# Patient Record
Sex: Female | Born: 1985 | Race: Black or African American | Hispanic: No | Marital: Single | Smoking: Current every day smoker
Health system: Southern US, Community
[De-identification: ages and names within clinical notes are randomized; demographics above are authoritative.]

## PROBLEM LIST (undated history)

## (undated) DIAGNOSIS — J40 Bronchitis, not specified as acute or chronic: Secondary | ICD-10-CM

## (undated) DIAGNOSIS — Z789 Other specified health status: Secondary | ICD-10-CM

## (undated) DIAGNOSIS — Z87442 Personal history of urinary calculi: Secondary | ICD-10-CM

## (undated) HISTORY — PX: KNEE ARTHROSCOPY W/ ACL RECONSTRUCTION: SHX1858

---

## 2016-02-25 ENCOUNTER — Encounter: Payer: Self-pay | Admitting: Emergency Medicine

## 2016-02-25 ENCOUNTER — Emergency Department
Admission: EM | Admit: 2016-02-25 | Discharge: 2016-02-25 | Disposition: A | Discharge status: Home / Self Care | Payer: Managed Care, Other (non HMO) | Attending: Emergency Medicine | Admitting: Emergency Medicine

## 2016-02-25 DIAGNOSIS — Y929 Unspecified place or not applicable: Secondary | ICD-10-CM | POA: Diagnosis not present

## 2016-02-25 DIAGNOSIS — Y999 Unspecified external cause status: Secondary | ICD-10-CM | POA: Diagnosis not present

## 2016-02-25 DIAGNOSIS — W228XXA Striking against or struck by other objects, initial encounter: Secondary | ICD-10-CM | POA: Insufficient documentation

## 2016-02-25 DIAGNOSIS — T180XXA Foreign body in mouth, initial encounter: Secondary | ICD-10-CM | POA: Diagnosis not present

## 2016-02-25 DIAGNOSIS — F17200 Nicotine dependence, unspecified, uncomplicated: Secondary | ICD-10-CM | POA: Insufficient documentation

## 2016-02-25 DIAGNOSIS — Y9389 Activity, other specified: Secondary | ICD-10-CM | POA: Insufficient documentation

## 2016-02-25 DIAGNOSIS — S00552A Superficial foreign body of oral cavity, initial encounter: Secondary | ICD-10-CM

## 2016-02-25 MED ORDER — CEPHALEXIN 500 MG PO CAPS: 500.0000 mg | ORAL_CAPSULE | Freq: Four times a day (QID) | ORAL | 0 refills | Status: AC

## 2016-02-25 MED ORDER — LIDOCAINE HCL (PF) 1 % IJ SOLN
5.0000 mL | Freq: Once | INTRAMUSCULAR | Status: AC
  Administered 2016-02-25: 5 mL
  Filled 2016-02-25: qty 5

## 2016-02-25 NOTE — ED Triage Notes (Signed)
States she has a tongue ring stuck   Unable to remove

## 2016-02-25 NOTE — ED Provider Notes (Signed)
[facility] Emergency Department Provider Note        Time seen: ----------------------------------------- 9:47 AM on 02/25/2016 -----------------------------------------    I have reviewed the triage vital signs and the nursing notes.   HISTORY  Chief Complaint Foreign Body    HPI Cassie Knapp is a 30 y.o. female who presents to ER after she has a tongue piercing stuck in the distal aspect of her tongue. Patient states she was unable to remove the tongue piercing. Patient states she's had them for some time, as never bothered her. Patient states the tip of her tongue is swollen and very painful to touch.   History reviewed. No pertinent past medical history.  There are no active problems to display for this patient.   History reviewed. No pertinent surgical history.  Allergies Patient has no known allergies.  Social History Social History  Substance Use Topics  . Smoking status: Current Every Day Smoker  . Smokeless tobacco: Never Used  . Alcohol use No    Review of Systems Constitutional: Negative for fever. ENT: Positive for tongue pain and swelling with foreign body Skin: Negative for rash. Neurological: Negative for headaches, focal weakness or numbness.  ____________________________________________   PHYSICAL EXAM:  VITAL SIGNS: ED Triage Vitals  Enc Vitals Group     BP 02/25/16 0918 119/66     Pulse Rate 02/25/16 0918 63     Resp 02/25/16 0918 15     Temp 02/25/16 0918 98.5 F (36.9 C)     Temp Source 02/25/16 0918 Oral     SpO2 02/25/16 0918 100 %     Weight 02/25/16 0908 165 lb (74.8 kg)     Height 02/25/16 0908 5\' 2"  (1.575 m)     Head Circumference --      Peak Flow --      Pain Score --      Pain Loc --      Pain Edu? --      Excl. in GC? --    Constitutional: Alert and oriented. Well appearing and in no distress. Eyes: Conjunctivae are normal. PERRL. Normal extraocular movements. ENT   Head:  Normocephalic and atraumatic.   Nose: No congestion/rhinnorhea.   Mouth/Throat: Patient with 2 piercings in her tongue, she has a vertical piercing through the distal third of the tongue and she has a horizontal piercing through the tip of the tongue. Tip of the tongue is swollen and tender to touch.   Neck: No stridor. Skin:  Skin is warm, dry and intact. No rash noted. Psychiatric: Mood and affect are normal. Speech and behavior are normal.  ____________________________________________  ED COURSE:  Pertinent labs & imaging results that were available during my care of the patient were reviewed by me and considered in my medical decision making (see chart for details). Clinical Course   Patient presents to the ER with possibly infected tongue piercing. We will need to perform foreign body removal.  .Foreign Body Removal Date/Time: 02/25/2016 10:30 AM Performed by: Emily FilbertWILLIAMS, Noam Karaffa E Authorized by: Daryel NovemberWILLIAMS, Kirk Sampley E  Consent: Verbal consent obtained. Consent given by: patient Patient understanding: patient states understanding of the procedure being performed Patient identity confirmed: verbally with patient Anesthesia: local infiltration  Anesthesia: Local Anesthetic: lidocaine 1% without epinephrine Anesthetic total: 3 mL  Sedation: Patient sedated: no Patient restrained: no Complexity: simple 1 objects recovered. Objects recovered: Tongue piercing Post-procedure assessment: foreign body removed Comments: Patient with a tongue piercing that appeared to be infected and the  tip of the tongue. This had been removed with bulk cutters. She will be placed on antibiotics, is stable for outpatient follow-up.   ____________________________________________  FINAL ASSESSMENT AND PLAN  Foreign body removal  Plan: Patient with foreign body removal as dictated above.Patient is no distress, will be discharged with Keflex. She is stable for discharge.   Emily FilbertWilliams,  Lisbeth Puller E, MD   Note: This dictation was prepared with Dragon dictation. Any transcriptional errors that result from this process are unintentional    Emily FilbertJonathan E Jaques Mineer, MD 02/25/16 1031

## 2016-02-25 NOTE — ED Notes (Signed)
Pt sent over from [facility]; reports changing her tongue ring approximately one week ago.  Pt now unable to remove tongue ring due to swelling of tongue.  Pt A/Ox4, able to speak full sentences, no respiratory distress noted at this time.

## 2016-05-26 ENCOUNTER — Emergency Department
Admission: EM | Admit: 2016-05-26 | Discharge: 2016-05-26 | Disposition: A | Discharge status: Home / Self Care | Payer: Commercial Managed Care - PPO | Attending: Emergency Medicine | Admitting: Emergency Medicine

## 2016-05-26 ENCOUNTER — Encounter: Payer: Self-pay | Admitting: Emergency Medicine

## 2016-05-26 DIAGNOSIS — J111 Influenza due to unidentified influenza virus with other respiratory manifestations: Secondary | ICD-10-CM | POA: Diagnosis not present

## 2016-05-26 DIAGNOSIS — J40 Bronchitis, not specified as acute or chronic: Secondary | ICD-10-CM | POA: Diagnosis not present

## 2016-05-26 DIAGNOSIS — R52 Pain, unspecified: Secondary | ICD-10-CM | POA: Diagnosis present

## 2016-05-26 DIAGNOSIS — F17200 Nicotine dependence, unspecified, uncomplicated: Secondary | ICD-10-CM | POA: Insufficient documentation

## 2016-05-26 LAB — POCT RAPID STREP A: Streptococcus, Group A Screen (Direct): NEGATIVE

## 2016-05-26 MED ORDER — ALBUTEROL SULFATE HFA 108 (90 BASE) MCG/ACT IN AERS: 1.0000 | INHALATION_SPRAY | Freq: Four times a day (QID) | RESPIRATORY_TRACT | 0 refills | Status: DC | PRN

## 2016-05-26 MED ORDER — OSELTAMIVIR PHOSPHATE 75 MG PO CAPS: 75.0000 mg | ORAL_CAPSULE | Freq: Two times a day (BID) | ORAL | 0 refills | Status: DC

## 2016-05-26 MED ORDER — AZITHROMYCIN 250 MG PO TABS: ORAL_TABLET | ORAL | 0 refills | Status: DC

## 2016-05-26 MED ORDER — PROMETHAZINE-DM 6.25-15 MG/5ML PO SYRP: 5.0000 mL | ORAL_SOLUTION | Freq: Four times a day (QID) | ORAL | 0 refills | Status: DC | PRN

## 2016-05-26 NOTE — ED Triage Notes (Signed)
States she developed scratchy throat on Saturday   Now having headache and body aches with fever/chills

## 2016-05-26 NOTE — ED Provider Notes (Signed)
[facility] Emergency Department Provider Note  ____________________________________________  Time seen: Approximately 9:09 AM  I have reviewed the triage vital signs and the nursing notes.   HISTORY  Chief Complaint Generalized Body Aches    HPI Cassie SkinnerMalisa Knapp is a 31 y.o. female , NAD, presents to the emergency department for evaluation of day history of sore throat and body aches. Patient states she had onset of scratchy sore throat on Saturday. Two days ago she woke with worsening sore throat and onset of body aches, fevers and chills with cough. Does note that her cough causes her chest and throat to hurt but denies any pain without coughing. Over the last couple of days symptoms have persisted with a mild headache. Has been taking over-the-counter medications to include cold medicine, Tylenol and ibuprofen without alleviation of her symptoms. States she was unable to work last night due to her illness. Denies chest pain, shortness breath, wheezing, abdominal pain, nausea, vomiting or diarrhea.    History reviewed. No pertinent past medical history.  There are no active problems to display for this patient.   History reviewed. No pertinent surgical history.  Prior to Admission medications   Medication Sig Start Date End Date Taking? Authorizing Provider  albuterol (PROVENTIL HFA;VENTOLIN HFA) 108 (90 Base) MCG/ACT inhaler Inhale 1-2 puffs into the lungs every 6 (six) hours as needed for wheezing or shortness of breath. 05/26/16   Saleah Rishel L Normagene Harvie, PA-C  azithromycin (ZITHROMAX Z-PAK) 250 MG tablet Take 2 tablets (500 mg) on  Day 1,  followed by 1 tablet (250 mg) once daily on Days 2 through 5. 05/26/16   Dariana Garbett L Desi Rowe, PA-C  oseltamivir (TAMIFLU) 75 MG capsule Take 1 capsule (75 mg total) by mouth 2 (two) times daily. 05/26/16   Joyleen Haselton L Kyson Kupper, PA-C  promethazine-dextromethorphan (PROMETHAZINE-DM) 6.25-15 MG/5ML syrup Take 5 mLs by mouth 4 (four) times daily as  needed for cough. 05/26/16   Daelin Haste L Betzayda Braxton, PA-C    Allergies Patient has no known allergies.  No family history on file.  Social History Social History  Substance Use Topics  . Smoking status: Current Every Day Smoker  . Smokeless tobacco: Never Used  . Alcohol use No     Review of Systems Constitutional: Positive subjective fever with chills and fatigue. Eyes: No visual changes. No discharge ENT: Positive sore throat, nasal congestion, runny nose. Cardiovascular: No chest pain. Respiratory: Positive painful, nonproductive cough. No shortness of breath. No wheezing.  Gastrointestinal: No abdominal pain.  No nausea, vomiting.  No diarrhea.   Musculoskeletal: Positive for general myalgias.  Skin: Negative for rash. Neurological: Positive for headaches, but no focal weakness or numbness. 10-point ROS otherwise negative.  ____________________________________________   PHYSICAL EXAM:  VITAL SIGNS: ED Triage Vitals  Enc Vitals Group     BP 05/26/16 0857 127/68     Pulse Rate 05/26/16 0857 80     Resp 05/26/16 0857 20     Temp 05/26/16 0857 98.3 F (36.8 C)     Temp Source 05/26/16 0857 Oral     SpO2 05/26/16 0857 100 %     Weight 05/26/16 0857 160 lb (72.6 kg)     Height 05/26/16 0857 5\' 2"  (1.575 m)     Head Circumference --      Peak Flow --      Pain Score 05/26/16 0900 7     Pain Loc --      Pain Edu? --      Excl.  in GC? --      Constitutional: Alert and oriented. Well appearing and in no acute distress. Eyes: Conjunctivae are normal.   Head: Atraumatic. ENT:      Ears: TMs visualized bilaterally with mild serous effusion but no bulging, erythema or perforation.      Nose: Moderate congestion with clear rhinorrhea. Bilateral turbinates are injected.      Mouth/Throat: Mucous membranes are moist. Pharynx with mild injection but no swelling or exudate. Clear postnasal drainage. Uvula is midline. Airway is patent. Neck: No stridor. No carotid bruits. Supple  with full range of motion. No meningismus. Hematological/Lymphatic/Immunilogical: No cervical lymphadenopathy. Cardiovascular: Normal rate, regular rhythm. Normal S1 and S2.  Good peripheral circulation. Respiratory: Normal respiratory effort without tachypnea or retractions. Lungs CTAB with breath sounds noted in all lung fields. No wheeze, rhonchi, rales. Neurologic:  Normal speech and language. No gross focal neurologic deficits are appreciated.  Skin:  Skin is warm, dry and intact. No rash noted. Psychiatric: Mood and affect are normal. Speech and behavior are normal. Patient exhibits appropriate insight and judgement.   ____________________________________________   LABS (all labs ordered are listed, but only abnormal results are displayed)  Labs Reviewed  CULTURE, GROUP A STREP [facility])  POCT RAPID STREP A   ____________________________________________  EKG  None ____________________________________________  RADIOLOGY  None ____________________________________________    PROCEDURES  Procedure(s) performed: None   Procedures   Medications - No data to display   ____________________________________________   INITIAL IMPRESSION / ASSESSMENT AND PLAN / ED COURSE  Pertinent labs & imaging results that were available during my care of the patient were reviewed by me and considered in my medical decision making (see chart for details).     Patient's diagnosis is consistent with influenza and bronchitis. Patient will be discharged home with prescriptions for Tamiflu, azithromycin, promethazine DM and albuterol inhaler to use as directed. May take over-the-counter Tylenol or ibuprofen as needed. 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 Patient is to follow up with [facility] if symptoms persist past this treatment course. Patient is given ED precautions to return to the ED for any worsening or new symptoms.    ____________________________________________  FINAL CLINICAL IMPRESSION(S) / ED DIAGNOSES  Final diagnoses:  Influenza  Bronchitis with influenza      NEW MEDICATIONS STARTED DURING THIS VISIT:  Discharge Medication List as of 05/26/2016  9:45 AM    START taking these medications   Details  albuterol (PROVENTIL HFA;VENTOLIN HFA) 108 (90 Base) MCG/ACT inhaler Inhale 1-2 puffs into the lungs every 6 (six) hours as needed for wheezing or shortness of breath., Starting Thu 05/26/2016, Print    azithromycin (ZITHROMAX Z-PAK) 250 MG tablet Take 2 tablets (500 mg) on  Day 1,  followed by 1 tablet (250 mg) once daily on Days 2 through 5., Print    oseltamivir (TAMIFLU) 75 MG capsule Take 1 capsule (75 mg total) by mouth 2 (two) times daily., Starting Thu 05/26/2016, Print    promethazine-dextromethorphan (PROMETHAZINE-DM) 6.25-15 MG/5ML syrup Take 5 mLs by mouth 4 (four) times daily as needed for cough., Starting Thu 05/26/2016, Print             Ernestene Kiel Revillo, PA-C 05/26/16 1144    Jene Every, MD 05/26/16 1400

## 2016-05-28 LAB — CULTURE, GROUP A STREP (THRC)

## 2016-05-30 ENCOUNTER — Encounter: Payer: Self-pay | Admitting: Emergency Medicine

## 2016-05-30 ENCOUNTER — Emergency Department: Payer: Commercial Managed Care - PPO

## 2016-05-30 ENCOUNTER — Emergency Department
Admission: EM | Admit: 2016-05-30 | Discharge: 2016-05-30 | Disposition: A | Discharge status: Home / Self Care | Payer: Commercial Managed Care - PPO | Attending: Emergency Medicine | Admitting: Emergency Medicine

## 2016-05-30 DIAGNOSIS — F17200 Nicotine dependence, unspecified, uncomplicated: Secondary | ICD-10-CM | POA: Insufficient documentation

## 2016-05-30 DIAGNOSIS — R05 Cough: Secondary | ICD-10-CM | POA: Diagnosis present

## 2016-05-30 DIAGNOSIS — J111 Influenza due to unidentified influenza virus with other respiratory manifestations: Secondary | ICD-10-CM | POA: Insufficient documentation

## 2016-05-30 DIAGNOSIS — Z79899 Other long term (current) drug therapy: Secondary | ICD-10-CM | POA: Diagnosis not present

## 2016-05-30 MED ORDER — ONDANSETRON HCL 4 MG PO TABS: 4.0000 mg | ORAL_TABLET | Freq: Three times a day (TID) | ORAL | 1 refills | Status: AC | PRN

## 2016-05-30 NOTE — ED Provider Notes (Signed)
[facility] Emergency Department Provider Note  ____________________________________________  Time seen: Approximately 3:52 PM  I have reviewed the triage vital signs and the nursing notes.   HISTORY  Chief Complaint Influenza    HPI Cassie Knapp is a 31 y.o. female presenting to the emergency department for follow-up. Patient states that she was diagnosed with influenza on February 15th, 2018. Patient states that she has had influenza-like symptoms for approximately 10 days. Patient states that productive cough, congestion and rhinorrhea have improved. Patient has been afebrile for the past "several days". Patient states that despite improvement with aforementioned symptoms, nausea and vomiting persist. Her last episode of vomiting was yesterday. Patient states that she feels like she needs "a few more days off from work to recover". Patient has been taking Tamiflu.and finished a course of azithromycin. Patient is in a monogamous relationship with her girlfriend.   History reviewed. No pertinent past medical history.  There are no active problems to display for this patient.   History reviewed. No pertinent surgical history.  Prior to Admission medications   Medication Sig Start Date End Date Taking? Authorizing Provider  albuterol (PROVENTIL HFA;VENTOLIN HFA) 108 (90 Base) MCG/ACT inhaler Inhale 1-2 puffs into the lungs every 6 (six) hours as needed for wheezing or shortness of breath. 05/26/16   Jami L Hagler, PA-C  azithromycin (ZITHROMAX Z-PAK) 250 MG tablet Take 2 tablets (500 mg) on  Day 1,  followed by 1 tablet (250 mg) once daily on Days 2 through 5. 05/26/16   Jami L Hagler, PA-C  ondansetron (ZOFRAN) 4 MG tablet Take 1 tablet (4 mg total) by mouth every 8 (eight) hours as needed for nausea or vomiting. 05/30/16 06/02/16  Orvil FeilJaclyn M Hortense Cantrall, PA-C  oseltamivir (TAMIFLU) 75 MG capsule Take 1 capsule (75 mg total) by mouth 2 (two) times daily. 05/26/16   Jami L  Hagler, PA-C  promethazine-dextromethorphan (PROMETHAZINE-DM) 6.25-15 MG/5ML syrup Take 5 mLs by mouth 4 (four) times daily as needed for cough. 05/26/16   Jami L Hagler, PA-C    Allergies Patient has no known allergies.  History reviewed. No pertinent family history.  Social History Social History  Substance Use Topics  . Smoking status: Current Every Day Smoker  . Smokeless tobacco: Never Used  . Alcohol use No     Review of Systems  Constitutional: Patient has had fever.  Eyes: No visual changes. No discharge ENT: Patient has had congestion.  Cardiovascular: no chest pain. Respiratory: Patient has had productive cough. No SOB. Gastrointestinal: Patient has had nausea and vomiting.  Genitourinary: Negative for dysuria. No hematuria Musculoskeletal: Patient has had myalgias. Skin: Negative for rash, abrasions, lacerations, ecchymosis. Neurological: Negative for headaches, focal weakness or numbness. ___________________________________________   PHYSICAL EXAM:  VITAL SIGNS: ED Triage Vitals [05/30/16 1428]  Enc Vitals Group     BP (!) 97/54     Pulse Rate 62     Resp 18     Temp 98.7 F (37.1 C)     Temp Source Oral     SpO2 100 %     Weight 160 lb (72.6 kg)     Height 5\' 2"  (1.575 m)     Head Circumference      Peak Flow      Pain Score 6     Pain Loc      Pain Edu?      Excl. in GC?      Constitutional: Alert and oriented. Patient is lying supine in  bed.  Eyes: Conjunctivae are normal. PERRL. EOMI. Head: Atraumatic. ENT:      Ears: Tympanic membranes are injected bilaterally without evidence of effusion or purulent exudate. Bony landmarks are visualized bilaterally. No pain with palpation at the tragus.      Nose: Nasal turbinates are edematous and erythematous. Copious rhinorrhea visualized.      Mouth/Throat: Mucous membranes are moist. Posterior pharynx is mildly erythematous. No tonsillar hypertrophy or purulent exudate. Uvula is midline. Neck: Full  range of motion. No pain is elicited with flexion at the neck. Hematological/Lymphatic/Immunilogical: No cervical lymphadenopathy. Cardiovascular: Normal rate, regular rhythm. Normal S1 and S2.  Good peripheral circulation. Respiratory: Normal respiratory effort without tachypnea or retractions. Lungs CTAB. Good air entry to the bases with no decreased or absent breath sounds. Gastrointestinal: Bowel sounds 4 quadrants. Soft and nontender to palpation. No guarding or rigidity. No palpable masses. No distention. No CVA tenderness.  Skin:  Skin is warm, dry and intact. No rash noted. Psychiatric: Mood and affect are normal. Speech and behavior are normal. Patient exhibits appropriate insight and judgement.  ____________________________________________   LABS (all labs ordered are listed, but only abnormal results are displayed)  Labs Reviewed - No data to display ____________________________________________  EKG   ____________________________________________  RADIOLOGY Geraldo Pitter, personally viewed and evaluated these images (plain radiographs) as part of my medical decision making, as well as reviewing the written report by the radiologist.    Dg Chest 2 View  Result Date: 05/30/2016 CLINICAL DATA:  Cough for last 10 days, possible pneumonia, flu last Thursday, persistent headache EXAM: CHEST  2 VIEW COMPARISON:  None. FINDINGS: Cardiomediastinal silhouette is unremarkable. No infiltrate or pleural effusion. No pulmonary edema. Bony thorax is unremarkable. IMPRESSION: No active cardiopulmonary disease. Electronically Signed   By: Natasha Mead M.D.   On: 05/30/2016 17:02    ____________________________________________    PROCEDURES  Procedure(s) performed:    Procedures    Medications - No data to display   ____________________________________________   INITIAL IMPRESSION / ASSESSMENT AND PLAN / ED COURSE  Pertinent labs & imaging results that were available  during my care of the patient were reviewed by me and considered in my medical decision making (see chart for details).  Review of the  [place]RS was performed in accordance of the [place] prior to dispensing any controlled drugs.     Assessment and Plan: Influenza:  Patient presents to the emergency department for follow-up for her flulike symptoms. Physical exam was reassuring. DG chest revealed no consolidations or findings consistent with pneumonia. Patient was discharged with Zofran for nausea. A work note was provided. Vital signs are reassuring at this time. All patient questions were answered.  ____________________________________________  FINAL CLINICAL IMPRESSION(S) / ED DIAGNOSES  Final diagnoses:  Influenza      NEW MEDICATIONS STARTED DURING THIS VISIT:  Discharge Medication List as of 05/30/2016  5:11 PM    START taking these medications   Details  ondansetron (ZOFRAN) 4 MG tablet Take 1 tablet (4 mg total) by mouth every 8 (eight) hours as needed for nausea or vomiting., Starting Mon 05/30/2016, Until Thu 06/02/2016, Print            This chart was dictated using voice recognition software/Dragon. Despite best efforts to proofread, errors can occur which can change the meaning. Any change was purely unintentional.    Orvil Feil, PA-C 05/30/16 2348    Arnaldo Natal, MD 05/31/16 0001

## 2016-05-30 NOTE — ED Notes (Signed)
See triage note  States she was seen last week for same sx's  States her body aches or less but now having dizziness and nausea  Afebrile on arrival

## 2016-05-30 NOTE — ED Triage Notes (Addendum)
Pt dx with flu last Thursday. Still having headaches, fevers, and nausea. Has body aches but they have improved.  Has had 3 episodes vomiting since Thursday. Last vomited yesterday. No diarrhea. Took motrin 900 am today.  Went to work and they told her to come back and get checked because still not feeling well and get a note to be out a little longer. Pt works at [employer] and is labor intensive.  Reports chest feels better when coughing and body aches are better but otherwise doesn't feel a lot better. Feels lightheaded at times still when stands for long time.

## 2016-06-03 ENCOUNTER — Emergency Department
Admission: EM | Admit: 2016-06-03 | Discharge: 2016-06-03 | Disposition: A | Discharge status: Home / Self Care | Payer: Commercial Managed Care - PPO | Attending: Emergency Medicine | Admitting: Emergency Medicine

## 2016-06-03 ENCOUNTER — Encounter: Payer: Self-pay | Admitting: Emergency Medicine

## 2016-06-03 DIAGNOSIS — R55 Syncope and collapse: Secondary | ICD-10-CM | POA: Insufficient documentation

## 2016-06-03 DIAGNOSIS — E162 Hypoglycemia, unspecified: Secondary | ICD-10-CM | POA: Insufficient documentation

## 2016-06-03 LAB — URINALYSIS, COMPLETE (UACMP) WITH MICROSCOPIC
Bacteria, UA: NONE SEEN
Bilirubin Urine: NEGATIVE
Glucose, UA: NEGATIVE mg/dL
Hgb urine dipstick: NEGATIVE
Ketones, ur: 5 mg/dL — AB
Leukocytes, UA: NEGATIVE
Nitrite: NEGATIVE
Protein, ur: NEGATIVE mg/dL
Specific Gravity, Urine: 1.015 (ref 1.005–1.030)
pH: 5 (ref 5.0–8.0)

## 2016-06-03 LAB — BASIC METABOLIC PANEL
Anion gap: 6 (ref 5–15)
BUN: 8 mg/dL (ref 6–20)
CO2: 27 mmol/L (ref 22–32)
Calcium: 9.2 mg/dL (ref 8.9–10.3)
Chloride: 107 mmol/L (ref 101–111)
Creatinine, Ser: 0.92 mg/dL (ref 0.44–1.00)
GFR calc Af Amer: >60 mL/min (ref >60)
GFR calc non Af Amer: >60 mL/min (ref >60)
Glucose, Bld: 96 mg/dL (ref 65–99)
Potassium: 3.5 mmol/L (ref 3.5–5.1)
Sodium: 140 mmol/L (ref 135–145)

## 2016-06-03 LAB — CBC
HCT: 37.3 % (ref 35.0–47.0)
Hemoglobin: 12.6 g/dL (ref 12.0–16.0)
MCH: 31.3 pg (ref 26.0–34.0)
MCHC: 33.8 g/dL (ref 32.0–36.0)
MCV: 92.8 fL (ref 80.0–100.0)
Platelets: 190 10*3/uL (ref 150–440)
RBC: 4.02 MIL/uL (ref 3.80–5.20)
RDW: 13.5 % (ref 11.5–14.5)
WBC: 4.6 10*3/uL (ref 3.6–11.0)

## 2016-06-03 LAB — GLUCOSE, CAPILLARY: Glucose-Capillary: 85 mg/dL (ref 65–99)

## 2016-06-03 LAB — POCT PREGNANCY, URINE: Preg Test, Ur: NEGATIVE

## 2016-06-03 NOTE — ED Notes (Signed)
Patient states that she was here 05/26/16 and diagnosed with the flu and bronchitis. Patient completed her tamiflu and ever since she has been having episode of lightheadedness and her blood pressure "dropping". Patient had a syncopal episode tonight at work, denies hitting head or injury. Patient states this happened once before and she was dehydrated. Patient states she ate dinner around 6pm and went to work at 9:00pm.

## 2016-06-03 NOTE — ED Notes (Signed)
ED Provider at bedside. 

## 2016-06-03 NOTE — ED Triage Notes (Signed)
Pt ambulatory to triage in NAD, report syncopal episode at work, was told her BP was low with SBP in 90s and BG low, given PO juice, BG in triage 85, BP WNL.

## 2016-06-03 NOTE — ED Provider Notes (Signed)
[facility] Emergency Department Provider Note    First MD Initiated Contact with Patient 06/03/16 [phone]     (approximate)  I have reviewed the triage vital signs and the nursing notes.   HISTORY  Chief Complaint Loss of Consciousness   HPI Cassie Knapp is a 31 y.o. female presents status post syncopal episode which occurred while at work. Patient states she had an episode of vomiting followed by "feeling really hot, tremulous and then passing out". Patient stated that her glucose was checked at her job and it was 63. Patient states she gets periods of dizziness quite often which are resolved following eating a "chocolate bar". Patient denies any symptoms at present   Past medical history Recent influenza There are no active problems to display for this patient.   Past surgical history None  Prior to Admission medications   Medication Sig Start Date End Date Taking? Authorizing Provider  albuterol (PROVENTIL HFA;VENTOLIN HFA) 108 (90 Base) MCG/ACT inhaler Inhale 1-2 puffs into the lungs every 6 (six) hours as needed for wheezing or shortness of breath. 05/26/16  Yes Jami L Hagler, PA-C  azithromycin (ZITHROMAX Z-PAK) 250 MG tablet Take 2 tablets (500 mg) on  Day 1,  followed by 1 tablet (250 mg) once daily on Days 2 through 5. Patient not taking: Reported on 06/03/2016 05/26/16   Jami L Hagler, PA-C  oseltamivir (TAMIFLU) 75 MG capsule Take 1 capsule (75 mg total) by mouth 2 (two) times daily. Patient not taking: Reported on 06/03/2016 05/26/16   Jami L Hagler, PA-C  promethazine-dextromethorphan (PROMETHAZINE-DM) 6.25-15 MG/5ML syrup Take 5 mLs by mouth 4 (four) times daily as needed for cough. Patient not taking: Reported on 06/03/2016 05/26/16   Jami L Hagler, PA-C    Allergies No known drug allergies History reviewed. No pertinent family history.  Social History Social History  Substance Use Topics  . Smoking status: Current Every Day Smoker  .  Smokeless tobacco: Never Used  . Alcohol use No    Review of Systems Constitutional: No fever/chills Eyes: No visual changes. ENT: No sore throat. Cardiovascular: Denies chest pain. Respiratory: Denies shortness of breath. Gastrointestinal: No abdominal pain.  No nausea, no vomiting.  No diarrhea.  No constipation. Genitourinary: Negative for dysuria. Musculoskeletal: Negative for back pain. Skin: Negative for rash. Neurological: Negative for headaches, focal weakness or numbness. Positive for syncopal episode  10-point ROS otherwise negative.  ____________________________________________   PHYSICAL EXAM:  VITAL SIGNS: ED Triage Vitals  Enc Vitals Group     BP 06/03/16 0253 117/78     Pulse Rate 06/03/16 0253 71     Resp 06/03/16 0253 16     Temp 06/03/16 0253 98.5 F (36.9 C)     Temp Source 06/03/16 0253 Oral     SpO2 06/03/16 0253 100 %     Weight 06/03/16 0254 165 lb (74.8 kg)     Height 06/03/16 0254 5\' 2"  (1.575 m)     Head Circumference --      Peak Flow --      Pain Score 06/03/16 0255 6     Pain Loc --      Pain Edu? --      Excl. in GC? --     Constitutional: Alert and oriented. Well appearing and in no acute distress. Eyes: Conjunctivae are normal. PERRL. EOMI. Head: Atraumatic. Mouth/Throat: Mucous membranes are moist. Oropharynx non-erythematous. Neck: No stridor. Cardiovascular: Normal rate, regular rhythm. Good peripheral circulation. Grossly normal heart  sounds. Respiratory: Normal respiratory effort.  No retractions. Lungs CTAB. Gastrointestinal: Soft and nontender. No distention.  Musculoskeletal: No lower extremity tenderness nor edema. No gross deformities of extremities. Neurologic:  Normal speech and language. No gross focal neurologic deficits are appreciated.  Skin:  Skin is warm, dry and intact. No rash noted. Psychiatric: Mood and affect are normal. Speech and behavior are normal.  ____________________________________________    LABS (all labs ordered are listed, but only abnormal results are displayed)  Labs Reviewed  URINALYSIS, COMPLETE (UACMP) WITH MICROSCOPIC - Abnormal; Notable for the following:       Result Value   Color, Urine YELLOW (*)    APPearance HAZY (*)    Ketones, ur 5 (*)    Squamous Epithelial / LPF 6-30 (*)    All other components within normal limits  BASIC METABOLIC PANEL  CBC  GLUCOSE, CAPILLARY  CBG MONITORING, ED  POC URINE PREG, ED  POCT PREGNANCY, URINE   ____________________________________________  EKG  ED ECG REPORT I, Bonaparte N Evanny Ellerbe, the attending physician, personally viewed and interpreted this ECG.   Date: 06/03/2016  EKG Time: 2:58 AM  Rate: 67  Rhythm: Normal sinus rhythm  Axis: Normal  Intervals: Normal  ST&T Change: None    Procedures      INITIAL IMPRESSION / ASSESSMENT AND PLAN / ED COURSE  Pertinent labs & imaging results that were available during my care of the patient were reviewed by me and considered in my medical decision making (see chart for details).  31 year old female presenting following syncopal episode while at work. History of physical exam concern for possible hypoglycemia this patient's symptoms completely resolved following drinking and eating.      ____________________________________________  FINAL CLINICAL IMPRESSION(S) / ED DIAGNOSES  Final diagnoses:  Hypoglycemia  Syncope, unspecified syncope type     MEDICATIONS GIVEN DURING THIS VISIT:  Medications - No data to display   NEW OUTPATIENT MEDICATIONS STARTED DURING THIS VISIT:  Discharge Medication List as of 06/03/2016  4:52 AM      Discharge Medication List as of 06/03/2016  4:52 AM      Discharge Medication List as of 06/03/2016  4:52 AM       Note:  This document was prepared using Dragon voice recognition software and may include unintentional dictation errors.    Darci Currentandolph N Evalyne Cortopassi, MD 06/03/16 [phone]

## 2016-08-03 ENCOUNTER — Emergency Department: Payer: Commercial Managed Care - PPO

## 2016-08-03 ENCOUNTER — Emergency Department
Admission: EM | Admit: 2016-08-03 | Discharge: 2016-08-03 | Disposition: A | Discharge status: Home / Self Care | Payer: Commercial Managed Care - PPO | Attending: Emergency Medicine | Admitting: Emergency Medicine

## 2016-08-03 ENCOUNTER — Encounter: Payer: Self-pay | Admitting: *Deleted

## 2016-08-03 DIAGNOSIS — Z79899 Other long term (current) drug therapy: Secondary | ICD-10-CM | POA: Insufficient documentation

## 2016-08-03 DIAGNOSIS — Y9389 Activity, other specified: Secondary | ICD-10-CM | POA: Diagnosis not present

## 2016-08-03 DIAGNOSIS — Y998 Other external cause status: Secondary | ICD-10-CM | POA: Insufficient documentation

## 2016-08-03 DIAGNOSIS — F17200 Nicotine dependence, unspecified, uncomplicated: Secondary | ICD-10-CM | POA: Diagnosis not present

## 2016-08-03 DIAGNOSIS — S8991XA Unspecified injury of right lower leg, initial encounter: Secondary | ICD-10-CM | POA: Diagnosis present

## 2016-08-03 DIAGNOSIS — X58XXXA Exposure to other specified factors, initial encounter: Secondary | ICD-10-CM | POA: Diagnosis not present

## 2016-08-03 DIAGNOSIS — S8391XA Sprain of unspecified site of right knee, initial encounter: Secondary | ICD-10-CM | POA: Diagnosis not present

## 2016-08-03 DIAGNOSIS — Y929 Unspecified place or not applicable: Secondary | ICD-10-CM | POA: Diagnosis not present

## 2016-08-03 MED ORDER — CYCLOBENZAPRINE HCL 5 MG PO TABS: 5.0000 mg | ORAL_TABLET | Freq: Three times a day (TID) | ORAL | 0 refills | Status: DC | PRN

## 2016-08-03 MED ORDER — NABUMETONE 750 MG PO TABS: 750.0000 mg | ORAL_TABLET | Freq: Two times a day (BID) | ORAL | 0 refills | Status: DC

## 2016-08-03 NOTE — Discharge Instructions (Signed)
Your exam and x-ray shows a sprain to the knee with a small effusion (fluid). Take the prescription meds as directed. Rest with the leg elevated and apply ice to reduce symptoms. Follow-up with Dr. Joice Lofts for continued symptoms. Wear the knee brace as needed for support.

## 2016-08-03 NOTE — ED Notes (Signed)
Right knee swollen since Friday, unaware of what caused this pain Friday, had knee surgery 2014, states the pain is the same as before surgery. Popliteal pulses present. Pt "favoring" right need, unable to bear full weight.

## 2016-08-03 NOTE — ED Notes (Signed)
Right knee ace wrap. Pt instructed how to wrap.   Right knee immobilizer placed. Pt shown how to adjust/apply and verbalized understanding.

## 2016-08-03 NOTE — ED Triage Notes (Signed)
Pt complains of right knee pain, pt reports an old injury to right knee

## 2016-08-03 NOTE — ED Provider Notes (Signed)
[facility] Emergency Department Provider Note ____________________________________________  Time seen: 1022  I have reviewed the triage vital signs and the nursing notes.  HISTORY  Chief Complaint  Knee Pain  HPI Cassie Knapp is a 31 y.o. female presents to the ED for evaluation of right knee pain. She has a history of a arthroscopic ACL and MMT repair in 2014. She reports playing with her kids when she later noticed some swelling and stiffness to the knee. She denies any catch, click, lock, or giveway. She has pain with attempted full weight-bearing. She denies any significant relief with ibuprofen.   History reviewed. No pertinent past medical history.  There are no active problems to display for this patient.  History reviewed. No pertinent surgical history.  Prior to Admission medications   Medication Sig Start Date End Date Taking? Authorizing Provider  albuterol (PROVENTIL HFA;VENTOLIN HFA) 108 (90 Base) MCG/ACT inhaler Inhale 1-2 puffs into the lungs every 6 (six) hours as needed for wheezing or shortness of breath. 05/26/16   Jami L Hagler, PA-C  azithromycin (ZITHROMAX Z-PAK) 250 MG tablet Take 2 tablets (500 mg) on  Day 1,  followed by 1 tablet (250 mg) once daily on Days 2 through 5. Patient not taking: Reported on 06/03/2016 05/26/16   Jami L Hagler, PA-C  cyclobenzaprine (FLEXERIL) 5 MG tablet Take 1 tablet (5 mg total) by mouth 3 (three) times daily as needed for muscle spasms. 08/03/16   Dechelle Attaway V Bacon Belynda Pagaduan, PA-C  nabumetone (RELAFEN) 750 MG tablet Take 1 tablet (750 mg total) by mouth 2 (two) times daily. 08/03/16   Delories Mauri V Bacon Cleophus Mendonsa, PA-C  oseltamivir (TAMIFLU) 75 MG capsule Take 1 capsule (75 mg total) by mouth 2 (two) times daily. Patient not taking: Reported on 06/03/2016 05/26/16   Jami L Hagler, PA-C  promethazine-dextromethorphan (PROMETHAZINE-DM) 6.25-15 MG/5ML syrup Take 5 mLs by mouth 4 (four) times daily as needed for cough. Patient  not taking: Reported on 06/03/2016 05/26/16   Jami L Hagler, PA-C    Allergies Patient has no known allergies.  No family history on file.  Social History Social History  Substance Use Topics  . Smoking status: Current Every Day Smoker  . Smokeless tobacco: Never Used  . Alcohol use No    Review of Systems  Constitutional: Negative for fever. Cardiovascular: Negative for chest pain. Respiratory: Negative for shortness of breath. Musculoskeletal: Negative for back pain. Right knee pain. Skin: Negative for rash. Neurological: Negative for headaches, focal weakness or numbness. ____________________________________________  PHYSICAL EXAM:  VITAL SIGNS: ED Triage Vitals  Enc Vitals Group     BP 08/03/16 0926 (!) 108/57     Pulse Rate 08/03/16 0926 93     Resp 08/03/16 0926 20     Temp 08/03/16 0926 97.9 F (36.6 C)     Temp Source 08/03/16 0926 Oral     SpO2 08/03/16 0926 100 %     Weight --      Height --      Head Circumference --      Peak Flow --      Pain Score 08/03/16 0923 0     Pain Loc --      Pain Edu? --      Excl. in GC? --     Constitutional: Alert and oriented. Well appearing and in no distress. Head: Normocephalic and atraumatic. Cardiovascular: Normal rate, regular rhythm. Normal distal pulses. Respiratory: Normal respiratory effort.  Musculoskeletal: Right knee with small effusion  noted. Normal patella tracking. Decreased flexion and extension ROM due to pain. No popliteal space fullness or calf/achilles tenderness. Negative anterior/posterior drawer. Nontender with normal range of motion in all other extremities.  Neurologic:  Antalgic gait without ataxia. Normal speech and language. No gross focal neurologic deficits are appreciated. Skin:  Skin is warm, dry and intact. No rash noted. ____________________________________________   RADIOLOGY  Right Knee IMPRESSION: 1. No fracture, bone lesion or acute finding. 2. Minor osteoarthritis.  Small  joint effusions suggested.  I, Zaniyah Wernette, Charlesetta Ivory, personally viewed and evaluated these images (plain radiographs) as part of my medical decision making, as well as reviewing the written report by the radiologist. ____________________________________________  PROCEDURES  Ace bandage Knee immobilizer ____________________________________________  INITIAL IMPRESSION / ASSESSMENT AND PLAN / ED COURSE  Patient acute right knee sprain without signs of internal derangement. She is reassured by her x-rays and exam. She will be referred to Dr. Joice Lofts for further evaluation. She is fitted with an ace bandage, but encouraged to wear her post-op hinged knee brace for support. Prescriptions for nabumetone and cyclobenzaprine are proveded.  ____________________________________________  FINAL CLINICAL IMPRESSION(S) / ED DIAGNOSES  Final diagnoses:  Sprain of right knee, unspecified ligament, initial encounter     Lissa Hoard, PA-C 08/07/16 0040    Emily Filbert, MD 08/15/16 [phone]

## 2016-08-24 ENCOUNTER — Other Ambulatory Visit: Payer: Self-pay | Admitting: Surgery

## 2016-08-24 DIAGNOSIS — S83231D Complex tear of medial meniscus, current injury, right knee, subsequent encounter: Secondary | ICD-10-CM

## 2016-09-02 ENCOUNTER — Ambulatory Visit
Admission: RE | Admit: 2016-09-02 | Discharge: 2016-09-02 | Disposition: A | Discharge status: Home / Self Care | Payer: Commercial Managed Care - PPO | Source: Ambulatory Visit | Attending: Surgery | Admitting: Surgery

## 2016-09-02 DIAGNOSIS — M25461 Effusion, right knee: Secondary | ICD-10-CM | POA: Insufficient documentation

## 2016-09-02 DIAGNOSIS — T8489XD Other specified complication of internal orthopedic prosthetic devices, implants and grafts, subsequent encounter: Secondary | ICD-10-CM | POA: Insufficient documentation

## 2016-09-02 DIAGNOSIS — S83231D Complex tear of medial meniscus, current injury, right knee, subsequent encounter: Secondary | ICD-10-CM | POA: Diagnosis not present

## 2016-09-02 DIAGNOSIS — X58XXXD Exposure to other specified factors, subsequent encounter: Secondary | ICD-10-CM | POA: Insufficient documentation

## 2016-10-11 ENCOUNTER — Encounter: Payer: Self-pay | Admitting: *Deleted

## 2016-10-11 ENCOUNTER — Encounter
Admission: RE | Admit: 2016-10-11 | Discharge: 2016-10-11 | Disposition: A | Discharge status: Home / Self Care | Payer: Commercial Managed Care - PPO | Source: Ambulatory Visit | Attending: Surgery | Admitting: Surgery

## 2016-10-11 NOTE — Patient Instructions (Signed)
  Your procedure is scheduled on: 10/18/16 Report to Day Surgery. [facility]LO[facility] To find out your arrival time please call [phone] between 1PM - 3PM on 10/17/16  Remember: Instructions that are not followed completely may result in serious medical risk, up to and including death, or upon the discretion of your surgeon and anesthesiologist your surgery may need to be rescheduled.    __X__ 1. Do not eat food or drink liquids after midnight. No gum chewing or hard candies.     ____ 2. No Alcohol for 24 hours before or after surgery.   __X__ 3. Do Not Smoke For 24 Hours Prior to Your Surgery.   ____ 4. Bring all medications with you on the day of surgery if instructed.    _X___ 5. Notify your doctor if there is any change in your medical condition     (cold, fever, infections).       Do not wear jewelry, make-up, hairpins, clips or nail polish.  Do not wear lotions, powders, or perfumes. You may wear deodorant.  Do not shave 48 hours prior to surgery. Men may shave face and neck.  Do not bring valuables to the hospital.    [facility] is not responsible for any belongings or valuables.               Contacts, dentures or bridgework may not be worn into surgery.  Leave your suitcase in the car. After surgery it may be brought to your room.  For patients admitted to the hospital, discharge time is determined by your                treatment team.   Patients discharged the day of surgery will not be allowed to drive home.   __X__ Take these medicines the morning of surgery with A SIP OF WATER:    1. PRISTIQ  2.   3.   4.  5.  6.  ____ Fleet Enema (as directed)   ____ Use CHG Soap as directed  ____ Use inhalers on the day of surgery  ____ Stop metformin 2 days prior to surgery    ____ Take 1/2 of usual insulin dose the night before surgery and none on the morning of surgery.   ____ Stop Coumadin/Plavix/aspirin on   _X___ Stop Anti-inflammatories on         SPEAK WITH SURGEON OFFICE IF OK TO CONTINUE OR NOT   ____ Stop supplements until after surgery.    ____ Bring C-Pap to the hospital.

## 2016-10-17 MED ORDER — CEFAZOLIN SODIUM-DEXTROSE 2-4 GM/100ML-% IV SOLN
2.0000 g | Freq: Once | INTRAVENOUS | Status: AC
  Administered 2016-10-18: 2 g via INTRAVENOUS

## 2016-10-18 ENCOUNTER — Ambulatory Visit: Payer: Commercial Managed Care - PPO | Admitting: Anesthesiology

## 2016-10-18 ENCOUNTER — Encounter
Admission: RE | Disposition: A | Discharge status: Home / Self Care | Payer: Self-pay | Source: Ambulatory Visit | Attending: Surgery

## 2016-10-18 ENCOUNTER — Encounter: Payer: Self-pay | Admitting: *Deleted

## 2016-10-18 ENCOUNTER — Ambulatory Visit
Admission: RE | Admit: 2016-10-18 | Discharge: 2016-10-18 | Disposition: A | Discharge status: Home / Self Care | Payer: Commercial Managed Care - PPO | Source: Ambulatory Visit | Attending: Surgery | Admitting: Surgery

## 2016-10-18 DIAGNOSIS — Z791 Long term (current) use of non-steroidal anti-inflammatories (NSAID): Secondary | ICD-10-CM | POA: Insufficient documentation

## 2016-10-18 DIAGNOSIS — X58XXXA Exposure to other specified factors, initial encounter: Secondary | ICD-10-CM | POA: Diagnosis not present

## 2016-10-18 DIAGNOSIS — M1731 Unilateral post-traumatic osteoarthritis, right knee: Secondary | ICD-10-CM | POA: Diagnosis not present

## 2016-10-18 DIAGNOSIS — S83511A Sprain of anterior cruciate ligament of right knee, initial encounter: Secondary | ICD-10-CM | POA: Diagnosis not present

## 2016-10-18 DIAGNOSIS — Z79899 Other long term (current) drug therapy: Secondary | ICD-10-CM | POA: Diagnosis not present

## 2016-10-18 DIAGNOSIS — F17200 Nicotine dependence, unspecified, uncomplicated: Secondary | ICD-10-CM | POA: Diagnosis not present

## 2016-10-18 DIAGNOSIS — J45909 Unspecified asthma, uncomplicated: Secondary | ICD-10-CM | POA: Diagnosis not present

## 2016-10-18 HISTORY — PX: ANTERIOR CRUCIATE LIGAMENT (ACL) REVISION: SHX6707

## 2016-10-18 HISTORY — DX: Other specified health status: Z78.9

## 2016-10-18 HISTORY — DX: Personal history of urinary calculi: Z87.442

## 2016-10-18 LAB — POCT PREGNANCY, URINE: Preg Test, Ur: NEGATIVE

## 2016-10-18 SURGERY — REVISION, RECONSTRUCTION, KNEE, ACL
Anesthesia: General | Site: Knee | Laterality: Right | Wound class: Clean

## 2016-10-18 MED ORDER — ACETAMINOPHEN 10 MG/ML IV SOLN
INTRAVENOUS | Status: DC | PRN
  Administered 2016-10-18: 1000 mg via INTRAVENOUS

## 2016-10-18 MED ORDER — CEFAZOLIN SODIUM-DEXTROSE 2-4 GM/100ML-% IV SOLN
INTRAVENOUS | Status: AC
  Filled 2016-10-18: qty 100

## 2016-10-18 MED ORDER — DEXAMETHASONE SODIUM PHOSPHATE 10 MG/ML IJ SOLN
INTRAMUSCULAR | Status: AC
  Filled 2016-10-18: qty 1

## 2016-10-18 MED ORDER — ONDANSETRON HCL 4 MG/2ML IJ SOLN: 4.0000 mg | Freq: Four times a day (QID) | INTRAMUSCULAR | Status: DC | PRN

## 2016-10-18 MED ORDER — OXYCODONE HCL 5 MG PO TABS
ORAL_TABLET | ORAL | Status: AC
  Filled 2016-10-18: qty 1

## 2016-10-18 MED ORDER — LACTATED RINGERS IV SOLN
INTRAVENOUS | Status: DC
  Administered 2016-10-18 (×2): via INTRAVENOUS

## 2016-10-18 MED ORDER — PROPOFOL 10 MG/ML IV BOLUS
INTRAVENOUS | Status: DC | PRN
  Administered 2016-10-18: 200 mg via INTRAVENOUS

## 2016-10-18 MED ORDER — BACITRACIN-NEOMYCIN-POLYMYXIN 400-5-5000 EX OINT
TOPICAL_OINTMENT | CUTANEOUS | Status: AC
  Filled 2016-10-18: qty 4

## 2016-10-18 MED ORDER — OXYCODONE HCL 5 MG PO TABS
5.0000 mg | ORAL_TABLET | ORAL | 0 refills | Status: DC | PRN
Start: 2016-10-18 — End: 2021-08-28

## 2016-10-18 MED ORDER — ONDANSETRON HCL 4 MG PO TABS: 4.0000 mg | ORAL_TABLET | Freq: Four times a day (QID) | ORAL | Status: DC | PRN

## 2016-10-18 MED ORDER — LIDOCAINE HCL (PF) 1 % IJ SOLN
INTRAMUSCULAR | Status: AC
  Filled 2016-10-18: qty 30

## 2016-10-18 MED ORDER — DEXAMETHASONE SODIUM PHOSPHATE 10 MG/ML IJ SOLN
INTRAMUSCULAR | Status: DC | PRN
  Administered 2016-10-18: 10 mg via INTRAVENOUS

## 2016-10-18 MED ORDER — LIDOCAINE HCL (PF) 1 % IJ SOLN
INTRAMUSCULAR | Status: AC
  Filled 2016-10-18: qty 5

## 2016-10-18 MED ORDER — OXYCODONE HCL 5 MG PO TABS
5.0000 mg | ORAL_TABLET | ORAL | Status: DC | PRN
Start: 2016-10-18 — End: 2016-10-18

## 2016-10-18 MED ORDER — ONDANSETRON HCL 4 MG/2ML IJ SOLN
INTRAMUSCULAR | Status: AC
Start: 2016-10-18 — End: 2016-10-18
  Filled 2016-10-18: qty 2

## 2016-10-18 MED ORDER — NEOMYCIN-POLYMYXIN B GU 40-200000 IR SOLN
Status: AC
  Filled 2016-10-18: qty 4

## 2016-10-18 MED ORDER — METOCLOPRAMIDE HCL 10 MG PO TABS: 5.0000 mg | ORAL_TABLET | Freq: Three times a day (TID) | ORAL | Status: DC | PRN

## 2016-10-18 MED ORDER — FAMOTIDINE 20 MG PO TABS: 20.0000 mg | ORAL_TABLET | Freq: Once | ORAL | Status: DC

## 2016-10-18 MED ORDER — MIDAZOLAM HCL 2 MG/2ML IJ SOLN
INTRAMUSCULAR | Status: AC
  Filled 2016-10-18: qty 2

## 2016-10-18 MED ORDER — MIDAZOLAM HCL 2 MG/2ML IJ SOLN
INTRAMUSCULAR | Status: AC
  Administered 2016-10-18: 2 mg via INTRAVENOUS
  Filled 2016-10-18: qty 2

## 2016-10-18 MED ORDER — FENTANYL CITRATE (PF) 100 MCG/2ML IJ SOLN
INTRAMUSCULAR | Status: AC
  Administered 2016-10-18: 50 ug via INTRAVENOUS
  Filled 2016-10-18: qty 2

## 2016-10-18 MED ORDER — OXYCODONE HCL 5 MG/5ML PO SOLN: 5.0000 mg | Freq: Once | ORAL | Status: AC | PRN

## 2016-10-18 MED ORDER — FAMOTIDINE 20 MG PO TABS
ORAL_TABLET | ORAL | Status: AC
  Administered 2016-10-18: 09:00:00
  Filled 2016-10-18: qty 1

## 2016-10-18 MED ORDER — METOCLOPRAMIDE HCL 5 MG/ML IJ SOLN: 5.0000 mg | Freq: Three times a day (TID) | INTRAMUSCULAR | Status: DC | PRN

## 2016-10-18 MED ORDER — FENTANYL CITRATE (PF) 100 MCG/2ML IJ SOLN
50.0000 ug | Freq: Once | INTRAMUSCULAR | Status: AC
  Administered 2016-10-18: 50 ug via INTRAVENOUS

## 2016-10-18 MED ORDER — ONDANSETRON HCL 4 MG/2ML IJ SOLN
INTRAMUSCULAR | Status: DC | PRN
  Administered 2016-10-18: 4 mg via INTRAVENOUS

## 2016-10-18 MED ORDER — NEOMYCIN-POLYMYXIN B GU 40-200000 IR SOLN
Status: DC | PRN
  Administered 2016-10-18: 4 mL

## 2016-10-18 MED ORDER — MIDAZOLAM HCL 2 MG/2ML IJ SOLN
2.0000 mg | Freq: Once | INTRAMUSCULAR | Status: AC
  Administered 2016-10-18: 2 mg via INTRAVENOUS

## 2016-10-18 MED ORDER — FENTANYL CITRATE (PF) 100 MCG/2ML IJ SOLN
INTRAMUSCULAR | Status: AC
  Filled 2016-10-18: qty 2

## 2016-10-18 MED ORDER — POTASSIUM CHLORIDE IN NACL 20-0.9 MEQ/L-% IV SOLN
INTRAVENOUS | Status: DC
  Filled 2016-10-18: qty 1000

## 2016-10-18 MED ORDER — LIDOCAINE HCL (PF) 1 % IJ SOLN
INTRAMUSCULAR | Status: DC | PRN
  Administered 2016-10-18: 30 mL

## 2016-10-18 MED ORDER — FENTANYL CITRATE (PF) 100 MCG/2ML IJ SOLN
INTRAMUSCULAR | Status: DC | PRN
  Administered 2016-10-18 (×4): 50 ug via INTRAVENOUS

## 2016-10-18 MED ORDER — BUPIVACAINE-EPINEPHRINE (PF) 0.5% -1:200000 IJ SOLN
INTRAMUSCULAR | Status: AC
  Filled 2016-10-18: qty 60

## 2016-10-18 MED ORDER — OXYCODONE HCL 5 MG PO TABS
5.0000 mg | ORAL_TABLET | Freq: Once | ORAL | Status: AC | PRN
  Administered 2016-10-18: 5 mg via ORAL

## 2016-10-18 MED ORDER — BUPIVACAINE-EPINEPHRINE (PF) 0.5% -1:200000 IJ SOLN
INTRAMUSCULAR | Status: DC | PRN
  Administered 2016-10-18: 20 mL via PERINEURAL
  Administered 2016-10-18: 30 mL via PERINEURAL

## 2016-10-18 MED ORDER — PROPOFOL 10 MG/ML IV BOLUS
INTRAVENOUS | Status: AC
Start: 2016-10-18 — End: 2016-10-18
  Filled 2016-10-18: qty 20

## 2016-10-18 MED ORDER — FENTANYL CITRATE (PF) 100 MCG/2ML IJ SOLN
25.0000 ug | INTRAMUSCULAR | Status: DC | PRN
  Administered 2016-10-18: 50 ug via INTRAVENOUS
  Administered 2016-10-18 (×2): 25 ug via INTRAVENOUS

## 2016-10-18 MED ORDER — LIDOCAINE HCL (PF) 2 % IJ SOLN
INTRAMUSCULAR | Status: AC
  Filled 2016-10-18: qty 2

## 2016-10-18 MED ORDER — ROPIVACAINE HCL 5 MG/ML IJ SOLN
INTRAMUSCULAR | Status: AC
  Filled 2016-10-18: qty 30

## 2016-10-18 MED ORDER — LIDOCAINE HCL (CARDIAC) 20 MG/ML IV SOLN
INTRAVENOUS | Status: DC | PRN
  Administered 2016-10-18: 80 mg via INTRAVENOUS

## 2016-10-18 MED ORDER — ROPIVACAINE HCL 5 MG/ML IJ SOLN
INTRAMUSCULAR | Status: DC | PRN
  Administered 2016-10-18: 10 mL via PERINEURAL
  Administered 2016-10-18: 20 mL via PERINEURAL

## 2016-10-18 MED ORDER — ACETAMINOPHEN 10 MG/ML IV SOLN
INTRAVENOUS | Status: AC
  Filled 2016-10-18: qty 100

## 2016-10-18 MED ORDER — LIDOCAINE HCL (PF) 1 % IJ SOLN
INTRAMUSCULAR | Status: DC | PRN
  Administered 2016-10-18: 1 mL via INTRADERMAL

## 2016-10-18 SURGICAL SUPPLY — 76 items
BANDAGE ACE 4X5 VEL STRL LF (GAUZE/BANDAGES/DRESSINGS) ×2 IMPLANT
BANDAGE ACE 6X5 VEL STRL LF (GAUZE/BANDAGES/DRESSINGS) ×2 IMPLANT
BASIN GRAD PLASTIC 32OZ STRL (MISCELLANEOUS) ×2 IMPLANT
BIT DRILL CANN ENDO 4.5 STRL (BIT) ×2 IMPLANT
BIT DRILL TIP TW PIN PASSR15.5 (BIT) ×1 IMPLANT
BLADE FULL RADIUS 3.5 (BLADE) ×2 IMPLANT
BLADE OSC/SAGITTAL MD 9X18.5 (BLADE) ×2 IMPLANT
BLADE SHAVER 4.5X7 STR FR (MISCELLANEOUS) IMPLANT
BLADE SURG SZ10 CARB STEEL (BLADE) ×4 IMPLANT
BNDG ESMARK 6X12 TAN STRL LF (GAUZE/BANDAGES/DRESSINGS) ×2 IMPLANT
BRACE KNEE POST OP SHORT (BRACE) ×2 IMPLANT
BUR 5.5 NOTCHBLASTER STR (BURR) ×1 IMPLANT
BUR ACROMIONIZER 4.0 (BURR) ×2 IMPLANT
BURR 5.5 NOTCHBLASTER STR (BURR) ×2
CHLORAPREP W/TINT 26ML (MISCELLANEOUS) ×2 IMPLANT
COOLER POLAR GLACIER W/PUMP (MISCELLANEOUS) ×2 IMPLANT
COVER MAYO STAND STRL (DRAPES) ×2 IMPLANT
CUFF TOURN 24 STER (MISCELLANEOUS) IMPLANT
DEVICE SUCT BLK HOLE OR FLOOR (MISCELLANEOUS) IMPLANT
DEVICE ULTRABUTTON ADJ F/GRAFT (Orthopedic Implant) ×2 IMPLANT
DRAPE IMP U-DRAPE 54X76 (DRAPES) ×2 IMPLANT
DRAPE SHEET LG 3/4 BI-LAMINATE (DRAPES) ×4 IMPLANT
DRAPE SURG 17X11 SM STRL (DRAPES) ×4 IMPLANT
DRAPE TABLE BACK 80X90 (DRAPES) ×2 IMPLANT
DRILL TIP TWO PIN PASSER 15.5 (BIT) ×2
DRSG OPSITE POSTOP 3X4 (GAUZE/BANDAGES/DRESSINGS) ×2 IMPLANT
DRSG OPSITE POSTOP 4X6 (GAUZE/BANDAGES/DRESSINGS) ×2 IMPLANT
GLOVE BIO SURGEON STRL SZ8 (GLOVE) ×4 IMPLANT
GLOVE INDICATOR 8.0 STRL GRN (GLOVE) ×2 IMPLANT
GOWN STRL REUS W/ TWL LRG LVL3 (GOWN DISPOSABLE) ×3 IMPLANT
GOWN STRL REUS W/ TWL XL LVL3 (GOWN DISPOSABLE) ×1 IMPLANT
GOWN STRL REUS W/TWL LRG LVL3 (GOWN DISPOSABLE) ×3
GOWN STRL REUS W/TWL XL LVL3 (GOWN DISPOSABLE) ×1
GRADUATE 1200CC STRL 31836 (MISCELLANEOUS) ×2 IMPLANT
GUIDEWIRE 1.2MMX18 (WIRE) ×4 IMPLANT
HANDLE YANKAUER SUCT BULB TIP (MISCELLANEOUS) ×2 IMPLANT
IV LACTATED RINGER IRRG 3000ML (IV SOLUTION) ×4
IV LR IRRIG 3000ML ARTHROMATIC (IV SOLUTION) ×4 IMPLANT
KIT GUIDE PIN AND WIRE PACK (WIRE) ×2 IMPLANT
KIT RM TURNOVER STRD PROC AR (KITS) ×2 IMPLANT
MANIFOLD NEPTUNE II (INSTRUMENTS) ×2 IMPLANT
MAT BLUE FLOOR 46X72 FLO (MISCELLANEOUS) ×2 IMPLANT
NDL SAFETY 18GX1.5 (NEEDLE) ×2 IMPLANT
NEEDLE FILTER BLUNT 18X 1/2SAF (NEEDLE) ×1
NEEDLE FILTER BLUNT 18X1 1/2 (NEEDLE) ×1 IMPLANT
NEEDLE HYPO 21X1.5 SAFETY (NEEDLE) ×2 IMPLANT
NEEDLE MAYO CATGUT SZ4 (NEEDLE) ×2 IMPLANT
NS IRRIG 1000ML POUR BTL (IV SOLUTION) ×2 IMPLANT
PACK ARTHROSCOPY KNEE (MISCELLANEOUS) ×2 IMPLANT
PAD WRAPON POLAR KNEE (MISCELLANEOUS) ×1 IMPLANT
PENCIL ELECTRO HAND CTR (MISCELLANEOUS) ×2 IMPLANT
RETRIEVER SUT HEWSON (MISCELLANEOUS) ×2 IMPLANT
SCREW SOFTSILK 1.5 9X25 (Screw) ×2 IMPLANT
SPONGE LAP 18X18 5 PK (GAUZE/BANDAGES/DRESSINGS) IMPLANT
SPONGE XRAY 4X4 16PLY STRL (MISCELLANEOUS) ×2 IMPLANT
STAPLER SKIN PROX 35W (STAPLE) ×2 IMPLANT
STRAP SAFETY BODY (MISCELLANEOUS) ×2 IMPLANT
SUCTION FRAZIER HANDLE 10FR (MISCELLANEOUS) ×1
SUCTION TUBE FRAZIER 10FR DISP (MISCELLANEOUS) ×1 IMPLANT
SUT ETHIBOND 5-0 MS/4 CCS GRN (SUTURE) ×2
SUT FIBERWIRE #2 38 T-5 BLUE (SUTURE) ×4
SUT TICRON COATED BLUE 2 0 30 (SUTURE) IMPLANT
SUT VIC AB 2-0 CT1 27 (SUTURE) ×4
SUT VIC AB 2-0 CT1 TAPERPNT 27 (SUTURE) ×4 IMPLANT
SUTURE ETHBND 5-0 MS/4 CCS GRN (SUTURE) ×1 IMPLANT
SUTURE FIBERWR #2 38 T-5 BLUE (SUTURE) ×2 IMPLANT
SYR 30ML LL (SYRINGE) ×2 IMPLANT
SYR 50ML LL SCALE MARK (SYRINGE) ×2 IMPLANT
SYR BULB IRRIG 60ML STRL (SYRINGE) ×2 IMPLANT
SYRINGE 10CC LL (SYRINGE) ×2 IMPLANT
TOWEL OR 17X26 4PK STRL BLUE (TOWEL DISPOSABLE) ×6 IMPLANT
TUBING ARTHRO INFLOW-ONLY STRL (TUBING) ×2 IMPLANT
ULTRATAPE ×2 IMPLANT
WAND COBLATION FLOW 50 (SURGICAL WAND) ×2 IMPLANT
WAND HAND CNTRL MULTIVAC 90 (MISCELLANEOUS) IMPLANT
WRAPON POLAR PAD KNEE (MISCELLANEOUS) ×2

## 2016-10-18 NOTE — Anesthesia Postprocedure Evaluation (Signed)
Anesthesia Post Note  Patient: Cassie Knapp  Procedure(s) Performed: Procedure(s) (LRB): ANTERIOR CRUCIATE LIGAMENT (ACL) REVISION (Right)  Patient location during evaluation: PACU Anesthesia Type: General Level of consciousness: awake and alert Pain management: pain level controlled Vital Signs Assessment: post-procedure vital signs reviewed and stable Respiratory status: spontaneous breathing, nonlabored ventilation, respiratory function stable and patient connected to nasal cannula oxygen Cardiovascular status: blood pressure returned to baseline and stable Postop Assessment: no signs of nausea or vomiting Anesthetic complications: no     Last Vitals:  Vitals:   10/18/16 1318 10/18/16 1320  BP: (!) 102/53 94/74  Pulse: 81 76  Resp: 16 (!) 26  Temp:  36.6 C    Last Pain:  Vitals:   10/18/16 1310  TempSrc:   PainSc: 6                  Cassie Knapp

## 2016-10-18 NOTE — Transfer of Care (Addendum)
Immediate Anesthesia Transfer of Care Note  Patient: Cassie Knapp  Procedure(s) Performed: Procedure(s): ANTERIOR CRUCIATE LIGAMENT (ACL) REVISION (Right)  Patient Location: PACU  Anesthesia Type:General  Level of Consciousness: sedated  Airway & Oxygen Therapy: Patient Spontanous Breathing and Patient connected to face mask oxygen  Post-op Assessment: Report given to RN and Post -op Vital signs reviewed and stable  Post vital signs: Reviewed and stable  Last Vitals:  Vitals:   10/18/16 1318 10/18/16 1320  BP: (!) 102/53 94/74  Pulse: 81 76  Resp: 16 (!) 26  Temp:  36.6 C    Last Pain:  Vitals:   10/18/16 1310  TempSrc:   PainSc: 6       Patients Stated Pain Goal: 2 (10/18/16 [id number])  Complications: No apparent anesthesia complications

## 2016-10-18 NOTE — Op Note (Signed)
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  Post-Op Diagnosis:   Same.  Procedure:   Arthroscopically-assisted revision anterior cruciate ligament reconstruction using bone-patella tendon-bone autograft and abrasion chondroplasty with microfracturing of focal lateral femoral condylar lesion, right knee.  Surgeon:   Maryagnes Amos, MD  Assistant:   Horris Latino, PA-C  Anesthesia:   General laryngeal mask anesthesia with an adductor canal block placed preoperatively by the anesthesiologist.  Findings:   As above. The posterior cruciate ligament was in satisfactory condition, as were the medial and lateral menisci. There were grade 1 chondromalacial changes involving the medial femoral condyle, and focal grade 3-4 chondromalacial changes involving the weightbearing portion of the lateral femoral condyle.   Complications:   None  EBL:   20 cc  Fluids:   1000 cc crystalloid  TT:   120 minutes at 300 mmHg  Drains:   None  Closure:   Staples  Implants:   [employer] BioSilk interference screw 1, [employer] adjustable Endobutton  Brief Clinical Note:   The patient is a 31 year old female who is now nearly 5 years status post an allograft hamstring anterior cruciate ligament reconstruction performed in [place]. The patient began to develop progressive instability and pain in her knee over the past year or so without any clearcut injury. 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 The MRI scan also demonstrated early degenerative joint disease involving all compartments, but no new meniscal pathology. The patient presents at this time for arthroscopy, debridement, and reconstruction of the anterior cruciate ligament using bone-patella tendon-bone autograft.  Procedure:   The patient underwent placement of an  adductor canal block in the preoperative holding area being brought into the operating room and lain in the supine position. After adequate general laryngal mask anesthesia was obtained, a timeout was performed to verify the appropriate surgical site and side. An examination under anesthesia demonstrated a 2+ Lachman and a 1+ pivot shift. The knee was injected sterilely using a solution of 30 cc of 0.5% Sensorcaine with epinephrine and 30 cc of 1% lidocaine. The right lower extremity was prepped with ChloraPrep solution before being draped sterilely. Preoperative antibiotics were administered. The limb was exsanguinated with an Esmarch and the tourniquet inflated to 300 mmHg. A total of 20 cc of 0.5% Sensorcaine with epinephrine was injected into the expected incision sites and portal sites. An incision was made over the anterior aspect of the knee beginning at the inferior pole of the patella and extending distally and slightly medially to the tibial tubercle. The incision was carried down through the subcutaneous cutaneous tissues to expose the superficial retinaculum. This was split the length of the incision and the medial and lateral flaps elevated sufficiently to expose the medial and lateral borders of the patellar tendon. A Kelly clamp was passed beneath the patella tendon and the tendon width measured and found to be 30 mm. A 9 mm graft was marked and harvested using a #10 blade. Contiguous bone plugs from both the patella and the tibial tubercle were harvested using an oscillating saw and osteotomes. Drill holes were placed into each bone plug prior to removing it from the field. The graft was taken to the back table where it was prepared for later reimplantation. Meanwhile, the superficial fibers of the patellar tendon defect were loosely reapproximated using  2-0 Vicryl interrupted sutures.  The arthroscopic portion of the procedure was begun. Subcutaneous anteromedial and anterolateral portal was  created before the camera was placed in the anterolateral portal and instrumentation performed through the anteromedial portal. The knee was sequentially examined beginning in the suprapatellar pouch, then progressing to the patellofemoral space, the medial gutter compartment, the notch, and finally the lateral compartment and gutter. Abundant reactive synovial tissues anteriorly were debrided in order to improve visualization. The findings were as described above. The medial and lateral menisci were probed and found to be stable. An area approximately 1 x 1 cm demonstrated full-thickness articular cartilage loss on the lateral femoral condyle, so a microfracture procedure was performed in this area. The inflow was temporarily discontinued to verify that adequate blood flow was achieved. Once this was verified, the procedure was continued.  Attention was redirected to the notch. The remnants of the prior anterior cruciate ligament graft were debrided from the femoral and tibial sides before a notchplasty was performed. Care was taken to be sure the over-the-top position was identified using a Therapist, nutritional. Through a short anterolateral incision utilizing the patient's prior incision, the tibial guide was positioned and the guidewire drilled up through the proximal tibia into the notch. After verifying its position, it was overreamed with a 10 mm straight reamer. The bony debris was removed using the full-radius resector. Through the tibial tunnel, a 7 mm over-the-top guide was positioned at approximately the 10:30 position. With the knee flexed to 90, a guide wire was drilled up into the distal femur. The 4.5 mm reamer was advanced until it perforated the anterolateral femoral cortex. The length of the tunnel was measured and found to be approximate 40 mm. The 9 mm acorn reamer was placed over the guidewire and advanced to the appropriate depth to accommodate the femoral bone plug, approximately 25 mm. Again  the bony debris was removed using the full-radius resector.   With the knee flexed beyond 90, the Beath needle was passed up through the tibial tunnel into the femoral socket, then advanced so that it exited through the anterolateral aspect of the distal thigh. The lead sutures of the adjustable Endobutton device which echocardiogram applied to the femoral bone plug on the back table, were placed through the eye of the Beath needle and the Beath needle advanced proximally the suture/graft construct was slowly advanced until the Endobutton flipped after exiting the anterolateral distal femoral cortex. Once it was seated securely, the graft was advanced by pulling on the loop, drawing the graft up through the tibial tunnel and into the femoral socket. Distal traction demonstrated excellent fixation. The knee was brought into extension to be sure that there would be no impingement upon the roof the notch. The tibial plug was secured using a 9 x 20 mm BioSilk screw with a posterior drawer force applied to the knee. Additional tightening of the femoral side was performed. Once the graft was secured, a gentle Lachman maneuver demonstrated excellent stability with less than 2 mm of anterior displacement. A gentle pivot shift was negative.  The scope was reintroduced to be sure that tibial screw did not enter the joint before the instruments removed from the joint after suctioning the excess fluid. Bone graft material that had been harvested from the tibial tunnel reamings were packed into the patella and tibial tubercle defects before the superficial retinacular layer was reapproximated using 2-0 Vicryl interrupted sutures. The subcutaneous tissues were closed in two layers using 2-0 Vicryl interrupted  sutures before the skin was closed using staples. The smaller anterolateral femoral and anteromedial tibial incisions were reapproximated using 2-0 Vicryl interrupted sutures for the subcutaneous tissues before staples  were used to close the skin. Sterile occlusive dressings were applied to the knee before a Polar Care pad was applied. The patient was placed into a hinged knee brace with the hinges set at 0-90 but locked in extension. The patient was then awakened, extubated, and returned to the recovery room in satisfactory condition after tolerating the procedure well.

## 2016-10-18 NOTE — Anesthesia Post-op Follow-up Note (Cosign Needed)
Anesthesia QCDR form completed.        

## 2016-10-18 NOTE — Anesthesia Preprocedure Evaluation (Addendum)
Anesthesia Evaluation  Patient identified by MRN, date of birth, ID band Patient awake    Reviewed: Allergy & Precautions, H&P , NPO status , Patient's Chart, lab work & pertinent test results  History of Anesthesia Complications Negative for: history of anesthetic complications  Airway Mallampati: II  TM Distance: >3 FB Neck ROM: full    Dental  (+) Chipped   Pulmonary neg shortness of breath, asthma , Current Smoker,           Cardiovascular Exercise Tolerance: Good (-) angina(-) Past MI and (-) DOE negative cardio ROS       Neuro/Psych negative neurological ROS  negative psych ROS   GI/Hepatic Neg liver ROS, GERD  Controlled,  Endo/Other  negative endocrine ROS  Renal/GU      Musculoskeletal   Abdominal   Peds  Hematology negative hematology ROS (+)   Anesthesia Other Findings Past Medical History: No date: History of kidney stones No date: Medical history non-contributory  Past Surgical History: No date: KNEE ARTHROSCOPY W/ ACL RECONSTRUCTION  BMI    Body Mass Index:  30.18 kg/m      Reproductive/Obstetrics negative OB ROS                             Anesthesia Physical Anesthesia Plan  ASA: III  Anesthesia Plan: General LMA   Post-op Pain Management: GA combined w/ Regional for post-op pain   Induction: Intravenous  PONV Risk Score and Plan: 3 and Ondansetron, Dexamethasone, Propofol and Midazolam  Airway Management Planned: LMA  Additional Equipment:   Intra-op Plan:   Post-operative Plan: Extubation in OR  Informed Consent: I have reviewed the patients History and Physical, chart, labs and discussed the procedure including the risks, benefits and alternatives for the proposed anesthesia with the patient or authorized representative who has indicated his/her understanding and acceptance.   Dental Advisory Given  Plan Discussed with: Anesthesiologist, CRNA  and Surgeon  Anesthesia Plan Comments: (Patient consented for risks of anesthesia including but not limited to:  - adverse reactions to medications - damage to teeth, lips or other oral mucosa - sore throat or hoarseness - Damage to heart, brain, lungs or loss of life  Patient voiced understanding.)       Anesthesia Quick Evaluation

## 2016-10-18 NOTE — H&P (Signed)
Paper H&P to be scanned into permanent record. H&P reviewed and patient re-examined. No changes. 

## 2016-10-18 NOTE — Anesthesia Procedure Notes (Signed)
Procedure Name: LMA Insertion Performed by: Yolande Skoda Pre-anesthesia Checklist: Patient identified, Patient being monitored, Timeout performed, Emergency Drugs available and Suction available Patient Re-evaluated:Patient Re-evaluated prior to inductionOxygen Delivery Method: Circle system utilized Preoxygenation: Pre-oxygenation with 100% oxygen Intubation Type: IV induction Ventilation: Mask ventilation without difficulty LMA: LMA inserted LMA Size: 3.0 Tube type: Oral Number of attempts: 1 Placement Confirmation: positive ETCO2 and breath sounds checked- equal and bilateral Tube secured with: Tape Dental Injury: Teeth and Oropharynx as per pre-operative assessment        

## 2016-10-18 NOTE — Discharge Instructions (Addendum)
AMBULATORY SURGERY  DISCHARGE INSTRUCTIONS   1) The drugs that you were given will stay in your system until tomorrow so for the next 24 hours you should not:  A) Drive an automobile B) Make any legal decisions C) Drink any alcoholic beverage   2) You may resume regular meals tomorrow.  Today it is better to start with liquids and gradually work up to solid foods.  You may eat anything you prefer, but it is better to start with liquids, then soup and crackers, and gradually work up to solid foods.   3) Please notify your doctor immediately if you have any unusual bleeding, trouble breathing, redness and pain at the surgery site, drainage, fever, or pain not relieved by medication.    4) Additional Instructions:        Please contact your physician with any problems or Same Day Surgery at [phone], Monday through Friday 6 am to 4 pm, or [place] at [facility] Main number at [phone].Keep dressing dry and intact.  May shower after dressing changed on post-op day #4 (Saturday).  Cover staples with Band-Aids after drying off. Apply ice frequently to knee or use Polar Care. Take ibuprofen 800 mg TID with meals for 7-10 days, then as necessary. Take oxycodone as prescribed when needed.  May supplement with ES Tylenol if necessary. May weight-bear as tolerated so long as in brace locked in extension - use crutches as needed. Follow-up in 10-14 days or as scheduled.

## 2016-10-18 NOTE — Anesthesia Procedure Notes (Signed)
Anesthesia Regional Block: Adductor canal block   Pre-Anesthetic Checklist: ,, timeout performed, Correct Patient, Correct Site, Correct Laterality, Correct Procedure, Correct Position, site marked, Risks and benefits discussed,  Surgical consent,  Pre-op evaluation,  At surgeon's request and post-op pain management  Laterality: Lower and Right  Prep: chloraprep       Needles:  Injection technique: Single-shot  Needle Type: Echogenic Needle     Needle Length: 9cm  Needle Gauge: 21     Additional Needles:   Procedures: ultrasound guided,,,,,,,,  Narrative:  Start time: 10/18/2016 9:30 AM End time: 10/18/2016 9:33 AM Injection made incrementally with aspirations every 5 mL.  Performed by: Personally  Anesthesiologist: Margorie JohnPISCITELLO, Vy Badley K  Additional Notes: Functioning IV was confirmed and monitors were applied.  A echogenic needle was used. Sterile prep,hand hygiene and sterile gloves were used. Minimal sedation used for procedure.   No paresthesia endorsed by patient during the procedure.  Negative aspiration and negative test dose prior to incremental administration of local anesthetic. The patient tolerated the procedure well with no immediate complications.

## 2017-07-03 ENCOUNTER — Ambulatory Visit (HOSPITAL_COMMUNITY): Payer: Commercial Managed Care - PPO | Admitting: Psychology

## 2017-08-03 ENCOUNTER — Encounter

## 2017-08-03 ENCOUNTER — Ambulatory Visit (HOSPITAL_COMMUNITY): Payer: Commercial Managed Care - PPO | Admitting: Psychology

## 2017-08-03 ENCOUNTER — Encounter (HOSPITAL_COMMUNITY): Payer: Self-pay | Admitting: Psychology

## 2017-08-03 NOTE — Progress Notes (Signed)
Cassie SkinnerMalisa Knapp is a 32 y.o. female patient who didn't show for intake appointment.Marland Kitchen.        Forde RadonYATES,LEANNE, LPC

## 2017-08-04 ENCOUNTER — Encounter: Payer: Self-pay | Admitting: Emergency Medicine

## 2017-08-04 ENCOUNTER — Other Ambulatory Visit: Payer: Self-pay

## 2017-08-04 ENCOUNTER — Emergency Department
Admission: EM | Admit: 2017-08-04 | Discharge: 2017-08-04 | Disposition: A | Discharge status: Home / Self Care | Payer: Commercial Managed Care - PPO | Attending: Emergency Medicine | Admitting: Emergency Medicine

## 2017-08-04 DIAGNOSIS — F17210 Nicotine dependence, cigarettes, uncomplicated: Secondary | ICD-10-CM | POA: Diagnosis not present

## 2017-08-04 DIAGNOSIS — Z91010 Allergy to peanuts: Secondary | ICD-10-CM | POA: Diagnosis not present

## 2017-08-04 DIAGNOSIS — L03011 Cellulitis of right finger: Secondary | ICD-10-CM | POA: Diagnosis not present

## 2017-08-04 DIAGNOSIS — Z79899 Other long term (current) drug therapy: Secondary | ICD-10-CM | POA: Insufficient documentation

## 2017-08-04 DIAGNOSIS — M79644 Pain in right finger(s): Secondary | ICD-10-CM | POA: Diagnosis present

## 2017-08-04 MED ORDER — NAPROXEN 500 MG PO TABS: 500.0000 mg | ORAL_TABLET | Freq: Two times a day (BID) | ORAL | Status: DC

## 2017-08-04 MED ORDER — SULFAMETHOXAZOLE-TRIMETHOPRIM 800-160 MG PO TABS: 1.0000 | ORAL_TABLET | Freq: Two times a day (BID) | ORAL | 0 refills | Status: DC

## 2017-08-04 MED ORDER — LIDOCAINE-EPINEPHRINE-TETRACAINE (LET) SOLUTION
3.0000 mL | Freq: Once | NASAL | Status: AC
  Administered 2017-08-04: 3 mL via TOPICAL
  Filled 2017-08-04: qty 3

## 2017-08-04 MED ORDER — TRAMADOL HCL 50 MG PO TABS: 50.0000 mg | ORAL_TABLET | Freq: Four times a day (QID) | ORAL | 0 refills | Status: AC | PRN

## 2017-08-04 NOTE — ED Provider Notes (Signed)
[facility] Emergency Department Provider Note   ____________________________________________   First MD Initiated Contact with Patient 08/04/17 0901     (approximate)  I have reviewed the triage vital signs and the nursing notes.   HISTORY  Chief Complaint Finger Injury    HPI Cassie Knapp is a 32 y.o. female patient complaining of pain and swelling to the fourth digit right nailbed.  Patient had a manicure present 2 weeks ago but the swelling did not start until last week.  Patient described pain as "throbbing".  Rates pain as a 5/10.  No palliative measures for complaint.  Past Medical History:  Diagnosis Date  . History of kidney stones   . Medical history non-contributory     There are no active problems to display for this patient.   Past Surgical History:  Procedure Laterality Date  . ANTERIOR CRUCIATE LIGAMENT (ACL) REVISION Right 10/18/2016   Procedure: ANTERIOR CRUCIATE LIGAMENT (ACL) REVISION;  Surgeon: Christena FlakePoggi, John J, MD;  Location: [facility] ORS;  Service: Orthopedics;  Laterality: Right;  . KNEE ARTHROSCOPY W/ ACL RECONSTRUCTION      Prior to Admission medications   Medication Sig Start Date End Date Taking? Authorizing Provider  albuterol (PROVENTIL HFA;VENTOLIN HFA) 108 (90 Base) MCG/ACT inhaler Inhale 1-2 puffs into the lungs every 6 (six) hours as needed for wheezing or shortness of breath. 05/26/16   Hagler, Jami L, PA-C  azithromycin (ZITHROMAX Z-PAK) 250 MG tablet Take 2 tablets (500 mg) on  Day 1,  followed by 1 tablet (250 mg) once daily on Days 2 through 5. Patient not taking: Reported on 06/03/2016 05/26/16   Hagler, Jami L, PA-C  cyclobenzaprine (FLEXERIL) 5 MG tablet Take 1 tablet (5 mg total) by mouth 3 (three) times daily as needed for muscle spasms. 08/03/16   Menshew, Charlesetta IvoryJenise V Bacon, PA-C  desvenlafaxine (PRISTIQ) 50 MG 24 hr tablet Take 50 mg by mouth daily.    [provider]  ibuprofen (ADVIL,MOTRIN) 200 MG  tablet Take 400-800 mg by mouth 3 (three) times daily as needed for headache or moderate pain.    [provider]  naproxen (NAPROSYN) 500 MG tablet Take 1 tablet (500 mg total) by mouth 2 (two) times daily with a meal. 08/04/17   Joni ReiningSmith, Alasia Enge K, PA-C  oseltamivir (TAMIFLU) 75 MG capsule Take 1 capsule (75 mg total) by mouth 2 (two) times daily. Patient not taking: Reported on 06/03/2016 05/26/16   Hagler, Jami L, PA-C  oxyCODONE (ROXICODONE) 5 MG immediate release tablet Take 1-2 tablets (5-10 mg total) by mouth every 4 (four) hours as needed for severe pain. 10/18/16   Poggi, Excell SeltzerJohn J, MD  promethazine-dextromethorphan (PROMETHAZINE-DM) 6.25-15 MG/5ML syrup Take 5 mLs by mouth 4 (four) times daily as needed for cough. Patient not taking: Reported on 06/03/2016 05/26/16   Hagler, Jami L, PA-C  sulfamethoxazole-trimethoprim (BACTRIM DS,SEPTRA DS) 800-160 MG tablet Take 1 tablet by mouth 2 (two) times daily. 08/04/17   Joni ReiningSmith, Amarien Carne K, PA-C  traMADol (ULTRAM) 50 MG tablet Take 1 tablet (50 mg total) by mouth every 6 (six) hours as needed. 08/04/17 08/04/18  Joni ReiningSmith, Milla Wahlberg K, PA-C    Allergies Peanut-containing drug products  No family history on file.  Social History Social History   Tobacco Use  . Smoking status: Current Every Day Smoker    Packs/day: 0.25    Types: Cigarettes  . Smokeless tobacco: Never Used  Substance Use Topics  . Alcohol use: No  . Drug use: No  Review of Systems  Constitutional: No fever/chills Eyes: No visual changes. ENT: No sore throat. Cardiovascular: Denies chest pain. Respiratory: Denies shortness of breath. Gastrointestinal: No abdominal pain.  No nausea, no vomiting.  No diarrhea.  No constipation. Genitourinary: Negative for dysuria. Musculoskeletal: Negative for back pain. Skin: Negative for rash. Neurological: Negative for headaches, focal weakness or numbness. Allergic/Immunilogical:  Peanuts ____________________________________________   PHYSICAL EXAM:  VITAL SIGNS: ED Triage Vitals  Enc Vitals Group     BP 08/04/17 0842 108/66     Pulse Rate 08/04/17 0842 77     Resp 08/04/17 0842 16     Temp 08/04/17 0842 98.3 F (36.8 C)     Temp Source 08/04/17 0842 Oral     SpO2 08/04/17 0842 97 %     Weight 08/04/17 0844 155 lb (70.3 kg)     Height 08/04/17 0844 5\' 2"  (1.575 m)     Head Circumference --      Peak Flow --      Pain Score 08/04/17 0843 5     Pain Loc --      Pain Edu? --      Excl. in GC? --    Constitutional: Alert and oriented. Well appearing and in no acute distress. Cardiovascular: Normal rate, regular rhythm. Grossly normal heart sounds.  Good peripheral circulation. Respiratory: Normal respiratory effort.  No retractions. Lungs CTAB. Musculoskeletal: No lower extremity tenderness nor edema.  No joint effusions. Neurologic:  Normal speech and language. No gross focal neurologic deficits are appreciated. No gait instability. Skin:  Skin is warm, dry and intact. No rash noted.  Edema erythema proximal nail bed fourth digit right hand. Psychiatric: Mood and affect are normal. Speech and behavior are normal.  ____________________________________________   LABS (all labs ordered are listed, but only abnormal results are displayed)  Labs Reviewed - No data to display ____________________________________________  EKG   ____________________________________________  RADIOLOGY  ED MD interpretation:    Official radiology report(s): No results found.  ____________________________________________   PROCEDURES  Procedure(s) performed: None  Procedures  Critical Care performed: No  ____________________________________________   INITIAL IMPRESSION / ASSESSMENT AND PLAN / ED COURSE  As part of my medical decision making, I reviewed the following data within the electronic MEDICAL RECORD NUMBER    Patient presents with swollen, redness,  and pain to the fourth digit right hand status post Medicare.  Area is nonfluctuant and discussed with patient rationale for not incised and drained at this time.  Patient given discharge care instructions and advised take medication as directed.  Return to ED if condition worsens.      ____________________________________________   FINAL CLINICAL IMPRESSION(S) / ED DIAGNOSES  Final diagnoses:  Paronychia of finger of right hand     ED Discharge Orders        Ordered    sulfamethoxazole-trimethoprim (BACTRIM DS,SEPTRA DS) 800-160 MG tablet  2 times daily     08/04/17 0937    naproxen (NAPROSYN) 500 MG tablet  2 times daily with meals     08/04/17 0937    traMADol (ULTRAM) 50 MG tablet  Every 6 hours PRN     08/04/17 8119       Note:  This document was prepared using Dragon voice recognition software and may include unintentional dictation errors.    Joni Reining, PA-C 08/04/17 1478    Emily Filbert, MD 08/04/17 1002

## 2017-08-04 NOTE — ED Notes (Addendum)
See triage note  States she developed pain with some swelling to right 4 th finger several days ago  Denies any injury  States she did have her nail replaced about 2 weeks ago

## 2017-08-04 NOTE — ED Triage Notes (Signed)
Patient here with swelling at right ring finger nail bed.  Has manicure.  States swelling began 5 days ago getting worse with throbbing.  Notable swelling proximal to nailbed.

## 2017-08-04 NOTE — Discharge Instructions (Signed)
Advise Epsom salts soaks 5 to 10 minutes twice a day for 2 to 3 days.  Take medication as directed and follow discharge care instructions.

## 2018-01-07 IMAGING — MR MR KNEE*R* W/O CM
6 series · 36 of 40 positions shown · non-contrast
Comparison: None.

CLINICAL DATA: Anterior and posterior pain. Decreased range of
motion.

EXAM:
MRI OF THE RIGHT KNEE WITHOUT CONTRAST
TECHNIQUE: Multiplanar, multisequence MR imaging of the knee was performed. No
intravenous contrast was administered.

[Series 3: PD fat-sat · axial · 3.0mm · 0.50mm/px · z∈[-57,+61]mm · 9 of 37 slices shown (1 of 4)]
[im 1/37]
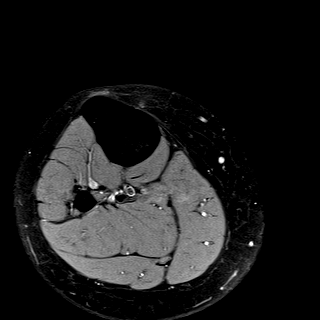
[im 5/37]
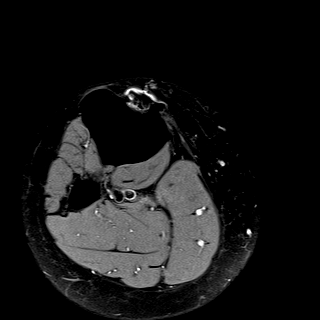
[im 10/37]
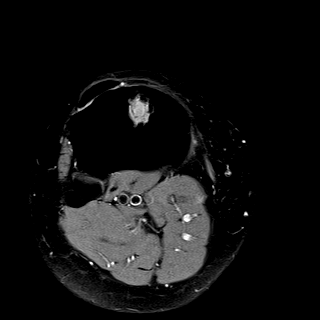
[im 14/37]
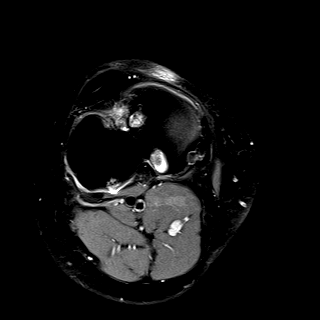
[im 19/37]
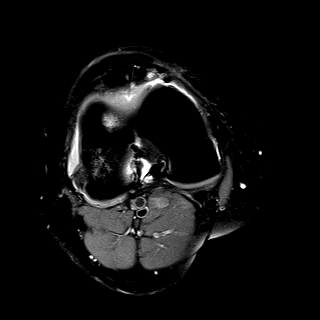
[im 23/37]
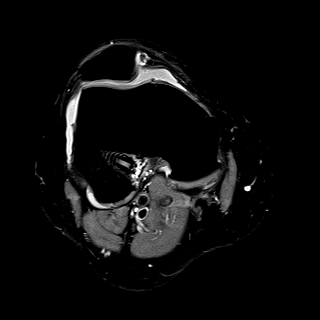
[im 28/37]
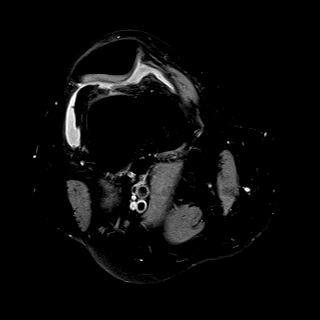
[im 32/37]
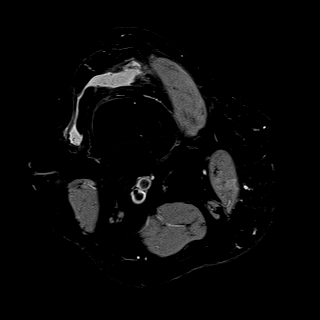
[im 37/37]
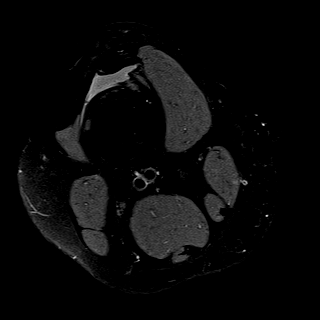

[Series 4: T1 · coronal · 3.0mm · 0.50mm/px · 3 of 29 slices shown]
[im 1/29]
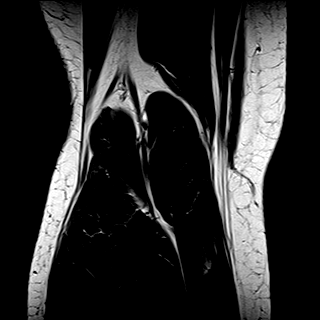
[im 5/29]
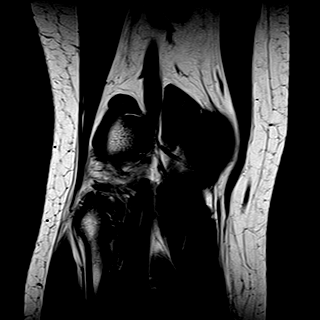
[im 10/29]
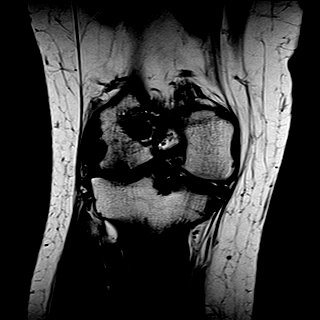

[Series 5: T2 fat-sat · coronal · 3.0mm · 0.31mm/px · 7 of 29 slices shown]
[im 1/29]
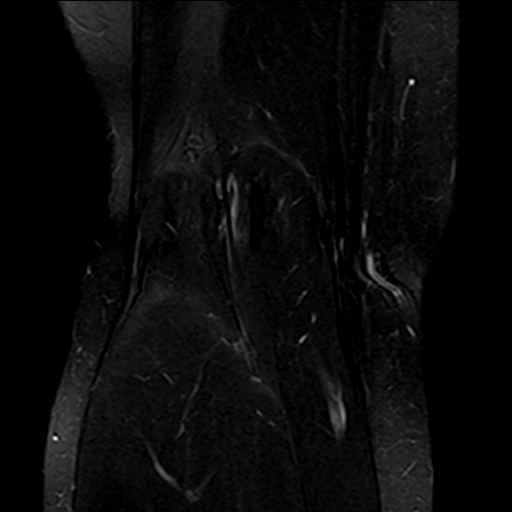
[im 5/29]
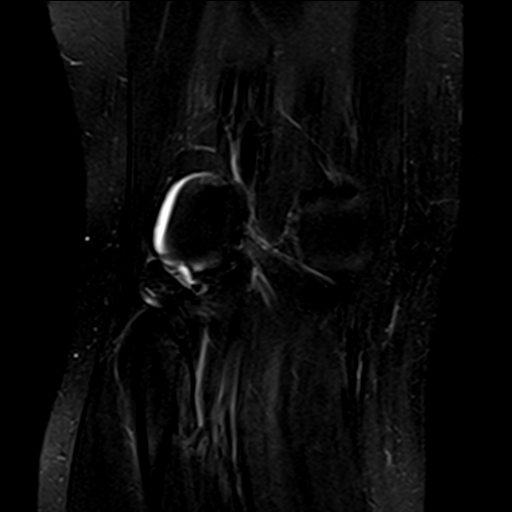
[im 10/29]
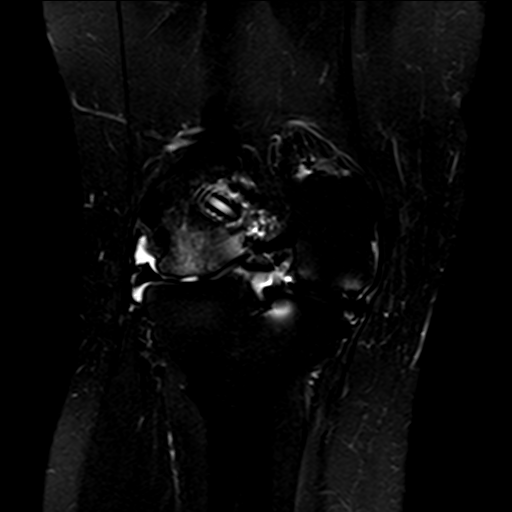
[im 15/29]
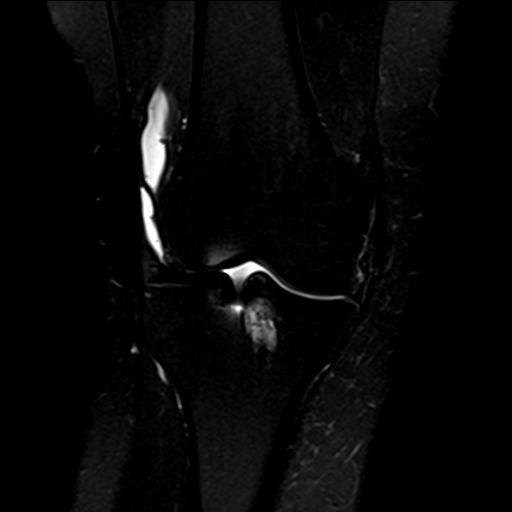
[im 19/29]
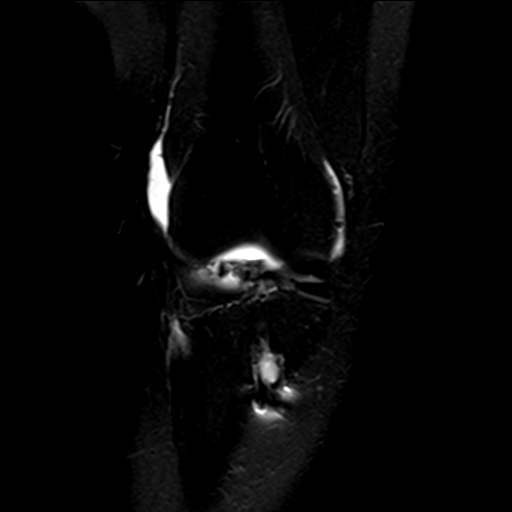
[im 24/29]
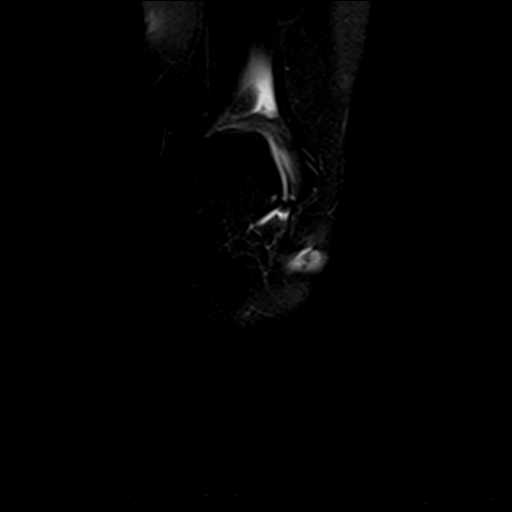
[im 29/29]
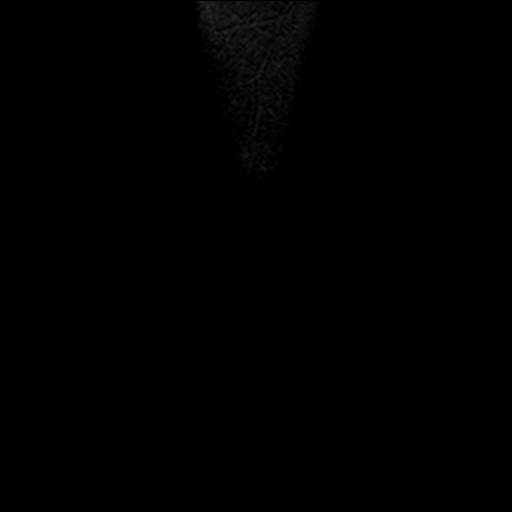

[Series 6: PD fat-sat · sagittal · 3.0mm · 0.50mm/px · 7 of 30 slices shown (2 of 4)]
[im 1/30]
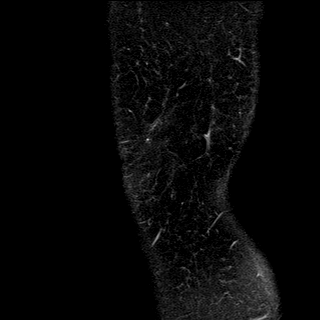
[im 5/30]
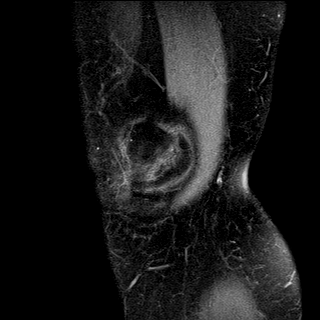
[im 10/30]
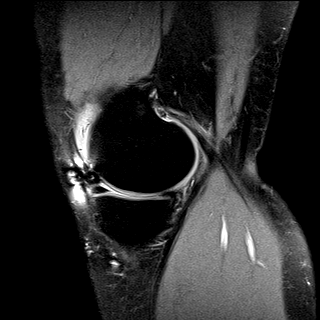
[im 15/30]
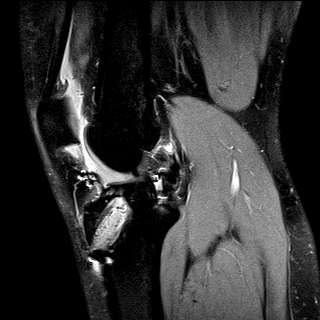
[im 20/30]
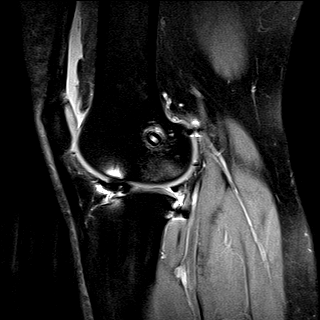
[im 25/30]
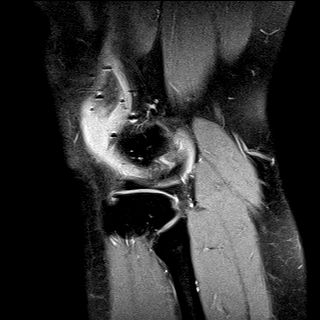
[im 30/30]
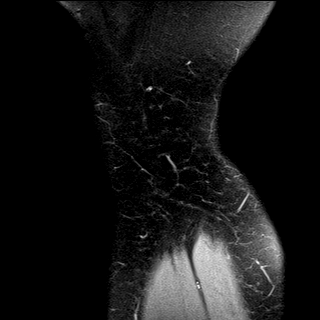

[Series 7: PD fat-sat · coronal · 3.0mm · 0.50mm/px · 7 of 29 slices shown (3 of 4)]
[im 1/29]
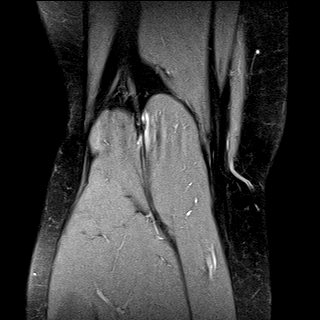
[im 5/29]
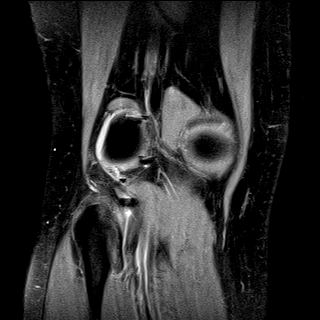
[im 10/29]
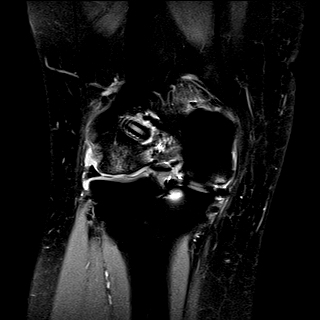
[im 15/29]
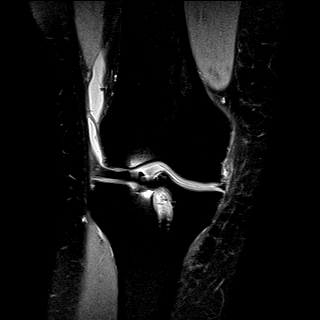
[im 19/29]
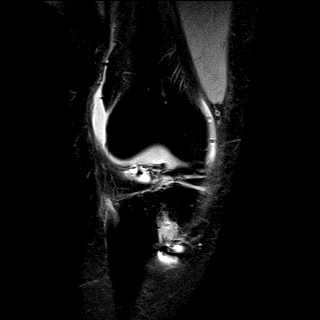
[im 24/29]
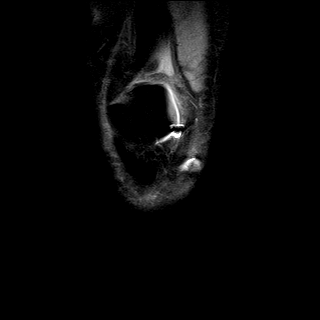
[im 29/29]
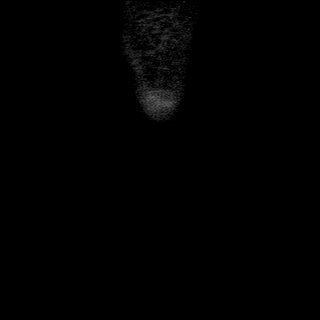

[Series 8: PD fat-sat · oblique · 2.0mm · 0.62mm/px · 3 of 11 slices shown (4 of 4)]
[im 1/11]
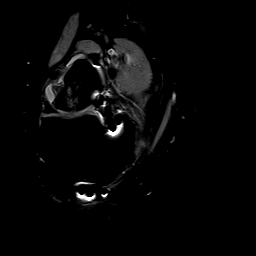
[im 6/11]
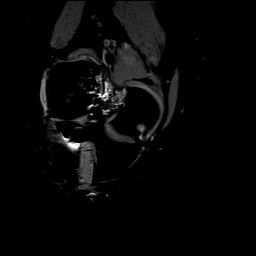
[im 11/11]
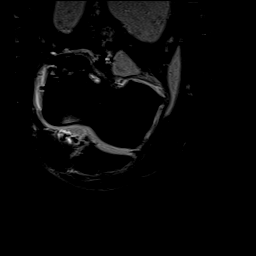

[36 of 40 positions shown; findings below may reference images not displayed]

FINDINGS: MENISCI

Medial meniscus: Prior meniscectomy of the posterior horn and body
of the medial meniscus.

Lateral meniscus:  Intact.

LIGAMENTS

Cruciates: Prior ACL repair with complete tear of the ACL graft.
Intact PCL.

Collaterals: Medial collateral ligament is intact. Lateral
collateral ligament complex is intact.

CARTILAGE

Patellofemoral: Severe cartilage fissuring with partial thickness
cartilage loss of the lateral patellar facet.

Medial: Mild partial-thickness cartilage loss of the medial
femorotibial compartment.

Lateral: High-grade partial-thickness cartilage loss with areas of
full-thickness cartilage loss of the lateral femoral condyle with
subchondral reactive marrow edema. Partial-thickness cartilage loss
of the lateral tibial plateau.

Joint: Moderate joint effusion. Postsurgical changes in Hoffa's fat.
No plical thickening.

Popliteal Fossa:  No Baker cyst. Intact popliteus tendon.

Extensor Mechanism: Intact quadriceps tendon and patellar tendon.
Intact medial and lateral patellar retinaculum. Intact MPFL. TT -TG
distance 12 mm.

Bones: No other marrow signal abnormality. No fracture or
dislocation.

Other: No fluid collection or hematoma
IMPRESSION: 1. Prior ACL repair with complete tear of the ACL graft.
2. Tricompartmental cartilage abnormalities most severe in the
lateral femorotibial compartment, as described above.
3. Moderate joint effusion.

## 2021-08-28 ENCOUNTER — Emergency Department (HOSPITAL_COMMUNITY)
Admission: EM | Admit: 2021-08-28 | Discharge: 2021-08-28 | Disposition: A | Discharge status: Home / Self Care | Payer: No Typology Code available for payment source | Attending: Emergency Medicine | Admitting: Emergency Medicine

## 2021-08-28 ENCOUNTER — Encounter (HOSPITAL_COMMUNITY): Payer: Self-pay | Admitting: Emergency Medicine

## 2021-08-28 ENCOUNTER — Other Ambulatory Visit: Payer: Self-pay

## 2021-08-28 DIAGNOSIS — M546 Pain in thoracic spine: Secondary | ICD-10-CM | POA: Diagnosis not present

## 2021-08-28 DIAGNOSIS — M549 Dorsalgia, unspecified: Secondary | ICD-10-CM | POA: Diagnosis present

## 2021-08-28 DIAGNOSIS — Z91010 Allergy to peanuts: Secondary | ICD-10-CM | POA: Diagnosis not present

## 2021-08-28 MED ORDER — CYCLOBENZAPRINE HCL 10 MG PO TABS: 10.0000 mg | ORAL_TABLET | Freq: Two times a day (BID) | ORAL | 0 refills | Status: DC | PRN

## 2021-08-28 MED ORDER — MELOXICAM 15 MG PO TABS: 15.0000 mg | ORAL_TABLET | Freq: Every day | ORAL | 0 refills | Status: AC

## 2021-08-28 MED ORDER — HYDROCODONE-ACETAMINOPHEN 5-325 MG PO TABS
1.0000 | ORAL_TABLET | Freq: Once | ORAL | Status: AC
  Administered 2021-08-28: 1 via ORAL
  Filled 2021-08-28: qty 1

## 2021-08-28 MED ORDER — MELOXICAM 7.5 MG PO TABS
15.0000 mg | ORAL_TABLET | Freq: Once | ORAL | Status: AC
  Administered 2021-08-28: 15 mg via ORAL
  Filled 2021-08-28: qty 2

## 2021-08-28 MED ORDER — LIDOCAINE 5 % EX PTCH
1.0000 | MEDICATED_PATCH | CUTANEOUS | Status: DC
  Administered 2021-08-28: 1 via TRANSDERMAL
  Filled 2021-08-28: qty 1

## 2021-08-28 NOTE — ED Provider Triage Note (Signed)
  Emergency Medicine Provider Triage Evaluation Note  MRN:  [id number]  Arrival date & time: 08/28/21    Medically screening exam initiated at 12:55 AM.   CC:   Back Pain   HPI:  Cassie Knapp is a 36 y.o. year-old female presents to the ED with chief complaint of mid back pain x 1 week.  Denies injuries.  Worse after sleeping.  Worse with palpation and movement.  Denies any injuries.  History provided by History provided by patient. ROS:  -As included in HPI PE:   Vitals:   08/28/21 0044  BP: (!) 113/91  Pulse: 88  Resp: 17  Temp: 97.9 F (36.6 C)  SpO2: 100%    Non-toxic appearing No respiratory distress Trapezius TTP bilaterally MDM:   I've ordered norco in triage to expedite lab/diagnostic workup.  Pain seems musculoskeletal.  Patient was informed that the remainder of the evaluation will be completed by another provider, this initial triage assessment does not replace that evaluation, and the importance of remaining in the ED until their evaluation is complete.    Roxy Horseman, PA-C 08/28/21 [phone]

## 2021-08-28 NOTE — ED Triage Notes (Signed)
Patient reports pain at upper back onset this week , denies injury or fall , pain increases with movement /changing positions , no cough or fever .

## 2021-08-29 NOTE — ED Provider Notes (Signed)
[facility] EMERGENCY DEPARTMENT Provider Note   CSN: [id number] Arrival date & time: 08/28/21  0030     History  Chief Complaint  Patient presents with   Back Pain    Cassie Knapp is a 36 y.o. female.  Here with upper trapezius pain for last couple days. [place] with movement and palpation. Tried some OTC meds without help. No inciting episodes.    Back Pain     Home Medications Prior to Admission medications   Medication Sig Start Date End Date Taking? Authorizing Provider  cyclobenzaprine (FLEXERIL) 10 MG tablet Take 1 tablet (10 mg total) by mouth 2 (two) times daily as needed for muscle spasms. 08/28/21  Yes Channah Godeaux, Barbara Cower, MD  meloxicam (MOBIC) 15 MG tablet Take 1 tablet (15 mg total) by mouth daily for 10 days. 08/28/21 09/07/21 Yes Kiaraliz Rafuse, Barbara Cower, MD  albuterol (PROVENTIL HFA;VENTOLIN HFA) 108 (90 Base) MCG/ACT inhaler Inhale 1-2 puffs into the lungs every 6 (six) hours as needed for wheezing or shortness of breath. 05/26/16   Hagler, Jami L, PA-C  desvenlafaxine (PRISTIQ) 50 MG 24 hr tablet Take 50 mg by mouth daily.    [provider]      Allergies    Peanut-containing drug products    Review of Systems   Review of Systems  Musculoskeletal:  Positive for back pain.   Physical Exam Updated Vital Signs BP (!) 96/48   Pulse (!) 55   Temp 98.4 F (36.9 C)   Resp 16   SpO2 99%  Physical Exam Vitals and nursing note reviewed.  Constitutional:      Appearance: She is well-developed.  HENT:     Head: Normocephalic and atraumatic.     Mouth/Throat:     Mouth: Mucous membranes are moist.     Pharynx: Oropharynx is clear.  Eyes:     Pupils: Pupils are equal, round, and reactive to light.  Cardiovascular:     Rate and Rhythm: Normal rate and regular rhythm.  Pulmonary:     Effort: No respiratory distress.     Breath sounds: No stridor.  Abdominal:     General: Abdomen is flat. There is no distension.  Musculoskeletal:         General: Tenderness (upper thoracic paraspinal/trapezius) present.     Cervical back: Normal range of motion.  Skin:    General: Skin is warm and dry.  Neurological:     General: No focal deficit present.     Mental Status: She is alert.    ED Results / Procedures / Treatments   Labs (all labs ordered are listed, but only abnormal results are displayed) Labs Reviewed - No data to display  EKG None  Radiology No results found.  Procedures Procedures    Medications Ordered in ED Medications  HYDROcodone-acetaminophen (NORCO/VICODIN) 5-325 MG per tablet 1 tablet (1 tablet Oral Given 08/28/21 0414)  meloxicam (MOBIC) tablet 15 mg (15 mg Oral Given 08/28/21 0602)    ED Course/ Medical Decision Making/ A&P                           Medical Decision Making Risk Prescription drug management.   Likely musuclar. Will treat supportively. No associated symptoms, trauma, recent illnesses or other symptoms to suggest need for imaging or other workup.    Final Clinical Impression(s) / ED Diagnoses Final diagnoses:  Acute bilateral thoracic back pain    Rx / DC Orders ED Discharge  Orders          Ordered    meloxicam (MOBIC) 15 MG tablet  Daily        08/28/21 0521    cyclobenzaprine (FLEXERIL) 10 MG tablet  2 times daily PRN        08/28/21 0521              Kielan Dreisbach, Barbara Cower, MD 08/29/21 0510

## 2022-03-01 ENCOUNTER — Ambulatory Visit (HOSPITAL_COMMUNITY)
Admission: EM | Admit: 2022-03-01 | Discharge: 2022-03-01 | Disposition: A | Discharge status: Home / Self Care | Payer: Commercial Managed Care - PPO | Attending: Nurse Practitioner | Admitting: Nurse Practitioner

## 2022-03-01 ENCOUNTER — Encounter (HOSPITAL_COMMUNITY): Payer: Self-pay | Admitting: Emergency Medicine

## 2022-03-01 DIAGNOSIS — J069 Acute upper respiratory infection, unspecified: Secondary | ICD-10-CM | POA: Diagnosis present

## 2022-03-01 DIAGNOSIS — Z1152 Encounter for screening for COVID-19: Secondary | ICD-10-CM | POA: Diagnosis present

## 2022-03-01 HISTORY — DX: Bronchitis, not specified as acute or chronic: J40

## 2022-03-01 LAB — RESP PANEL BY RT-PCR (FLU A&B, COVID) ARPGX2
Influenza A by PCR: POSITIVE — AB
Influenza B by PCR: NEGATIVE
SARS Coronavirus 2 by RT PCR: NEGATIVE

## 2022-03-01 LAB — POCT RAPID STREP A, ED / UC: Streptococcus, Group A Screen (Direct): NEGATIVE

## 2022-03-01 MED ORDER — ACETAMINOPHEN 325 MG PO TABS
ORAL_TABLET | ORAL | Status: AC
  Filled 2022-03-01: qty 3

## 2022-03-01 MED ORDER — IBUPROFEN 800 MG PO TABS: 800.0000 mg | ORAL_TABLET | Freq: Three times a day (TID) | ORAL | 0 refills | Status: AC | PRN

## 2022-03-01 MED ORDER — CHERATUSSIN AC 100-10 MG/5ML PO SOLN: 10.0000 mL | Freq: Three times a day (TID) | ORAL | 0 refills | Status: DC | PRN

## 2022-03-01 MED ORDER — ACETAMINOPHEN 325 MG PO TABS
975.0000 mg | ORAL_TABLET | Freq: Once | ORAL | Status: AC
  Administered 2022-03-01: 975 mg via ORAL

## 2022-03-01 MED ORDER — PREDNISONE 20 MG PO TABS: 40.0000 mg | ORAL_TABLET | Freq: Every day | ORAL | 0 refills | Status: AC

## 2022-03-01 NOTE — ED Provider Notes (Signed)
[facility]    CSN: [id number] Arrival date & time: 03/01/22  1659      History   Chief Complaint Chief Complaint  Patient presents with   Fever   Generalized Body Aches   Shortness of Breath    HPI Cassie Knapp is a 36 y.o. female.   Subjective:   Cassie Knapp is a 36 y.o. female who presents for evaluation of influenza like symptoms. Symptoms include fevers up to 103.8 degrees, chills, headache, myalgias, productive cough, nausea and mild sore throat. Chest hurts when coughing. She doesn't have much of an appetite but is drinking ok. She denies any runny nose, nasal congestion, vomiting or diarrhea. Symptoms have been present for 1 day. She has tried to alleviate the symptoms with ibuprofen, sore throat spray, Vics and rest with minimal relief. Patient doesn't have any high risk factors for influenza or COVID complications. Patient has had known exposure to strep. She went to [facility] over the past weekend. No other sick contacts. Patient has a history of COVID about a year ago. She has not had any of the COVID vaccines. No flu vaccine.   The following portions of the patient's history were reviewed and updated as appropriate: allergies, current medications, past family history, past medical history, past social history, past surgical history, and problem list.        Past Medical History:  Diagnosis Date   Bronchitis    History of kidney stones    Medical history non-contributory     There are no problems to display for this patient.   Past Surgical History:  Procedure Laterality Date   ANTERIOR CRUCIATE LIGAMENT (ACL) REVISION Right 10/18/2016   Procedure: ANTERIOR CRUCIATE LIGAMENT (ACL) REVISION;  Surgeon: Christena Flake, MD;  Location: [facility] ORS;  Service: Orthopedics;  Laterality: Right;   KNEE ARTHROSCOPY W/ ACL RECONSTRUCTION      OB History   No obstetric history on file.      Home Medications    Prior to Admission medications    Medication Sig Start Date End Date Taking? Authorizing Provider  ibuprofen (ADVIL) 800 MG tablet Take 1 tablet (800 mg total) by mouth every 8 (eight) hours as needed for fever or headache (pain). 03/01/22  Yes Lurline Idol, FNP  predniSONE (DELTASONE) 20 MG tablet Take 2 tablets (40 mg total) by mouth daily for 5 days. 03/01/22 03/06/22 Yes Ayinde Swim, Lelon Mast, FNP  guaiFENesin-codeine (CHERATUSSIN AC) 100-10 MG/5ML syrup Take 10 mLs by mouth 3 (three) times daily as needed for cough. 03/01/22   Lurline Idol, FNP    Family History No family history on file.  Social History Social History   Tobacco Use   Smoking status: Every Day    Packs/day: 0.25    Types: Cigarettes   Smokeless tobacco: Never  Vaping Use   Vaping Use: Never used  Substance Use Topics   Alcohol use: No   Drug use: No     Allergies   Peanut-containing drug products   Review of Systems Review of Systems  Constitutional:  Positive for appetite change and fever.  HENT:  Positive for sore throat. Negative for congestion and rhinorrhea.   Respiratory:  Positive for cough.   Cardiovascular:  Positive for chest pain.  Gastrointestinal:  Positive for nausea. Negative for diarrhea and vomiting.  Musculoskeletal:  Positive for myalgias.  Neurological:  Positive for weakness and headaches. Negative for dizziness.  All other systems reviewed and are negative.    Physical Exam Triage Vital  Signs ED Triage Vitals [03/01/22 1709]  Enc Vitals Group     BP (!) 105/49     Pulse Rate 99     Resp 20     Temp (!) 102.4 F (39.1 C)     Temp Source Oral     SpO2 100 %     Weight      Height      Head Circumference      Peak Flow      Pain Score      Pain Loc      Pain Edu?      Excl. in GC?    No data found.  Updated Vital Signs BP (!) 105/49 (BP Location: Right Arm)   Pulse 99   Temp (!) 102.4 F (39.1 C) (Oral)   Resp 20   LMP 02/12/2022   SpO2 100%   Visual Acuity Right Eye Distance:    Left Eye Distance:   Bilateral Distance:    Right Eye Near:   Left Eye Near:    Bilateral Near:     Physical Exam Vitals and nursing note reviewed.  Constitutional:      General: She is not in acute distress.    Appearance: She is well-developed. She is ill-appearing. She is not toxic-appearing or diaphoretic.  HENT:     Head: Normocephalic.     Mouth/Throat:     Mouth: Mucous membranes are moist.  Eyes:     Extraocular Movements: Extraocular movements intact.     Pupils: Pupils are equal, round, and reactive to light.  Cardiovascular:     Rate and Rhythm: Normal rate and regular rhythm.  Pulmonary:     Effort: Pulmonary effort is normal. No respiratory distress.     Breath sounds: Normal breath sounds.  Chest:     Chest wall: Tenderness present.  Abdominal:     Palpations: Abdomen is soft.  Musculoskeletal:        General: Normal range of motion.     Cervical back: Normal range of motion and neck supple.  Lymphadenopathy:     Cervical: No cervical adenopathy.  Skin:    General: Skin is warm and dry.  Neurological:     General: No focal deficit present.     Mental Status: She is alert and oriented to person, place, and time.  Psychiatric:        Mood and Affect: Mood normal.        Behavior: Behavior normal.      UC Treatments / Results  Labs (all labs ordered are listed, but only abnormal results are displayed) Labs Reviewed  RESP PANEL BY RT-PCR (FLU A&B, COVID) ARPGX2  CULTURE, GROUP A STREP [facility])  POCT RAPID STREP A, ED / UC    EKG   Radiology No results found.  Procedures Procedures (including critical care time)  Medications Ordered in UC Medications  acetaminophen (TYLENOL) tablet 975 mg (975 mg Oral Given 03/01/22 1721)    Initial Impression / Assessment and Plan / UC Course  I have reviewed the triage vital signs and the nursing notes.  Pertinent labs & imaging results that were available during my care of the patient were reviewed by  me and considered in my medical decision making (see chart for details).    36 yo female presenting with a one-day history of fever, chills, headache, myalgias, productive cough, nausea, sore throat and chest pain with coughing. She doesn't have much of an appetite but is drinking ok.  She denies any runny nose, nasal congestion, vomiting or diarrhea. Not much improvement with supportive care measures. Patient is febrile at 102.4. All other vital signs unremarkable. Patient is acutely ill appearing but nontoxic. She was given tylenol in the clinic. Rapid strep negative. Flu and COVID pending. Supportive care with medications, appropriate antipyretics and fluids advised.   Today's evaluation has revealed no signs of a dangerous process. Discussed diagnosis with patient and/or guardian. Patient and/or guardian aware of their diagnosis, possible red flag symptoms to watch out for and need for close follow up. Patient and/or guardian understands verbal and written discharge instructions. Patient and/or guardian comfortable with plan and disposition.  Patient and/or guardian has a clear mental status at this time, good insight into illness (after discussion and teaching) and has clear judgment to make decisions regarding their care  Documentation was completed with the aid of voice recognition software. Transcription may contain typographical errors. Final Clinical Impressions(s) / UC Diagnoses   Final diagnoses:  Viral upper respiratory tract infection  Encounter for screening for COVID-19     Discharge Instructions      Take medications as prescribed. You may take tylenol in addition to the prescribed medications as needed as well. Drink plenty of fluids. Stay in home isolation until you receive results of your COVID test. You will only be notified for positive results. You may go online to MyChart and review your results. Go to the ED immediately if you get worse or have any other symptoms.         ED Prescriptions     Medication Sig Dispense Auth. Provider   predniSONE (DELTASONE) 20 MG tablet Take 2 tablets (40 mg total) by mouth daily for 5 days. 10 tablet Lurline Idol, FNP   ibuprofen (ADVIL) 800 MG tablet Take 1 tablet (800 mg total) by mouth every 8 (eight) hours as needed for fever or headache (pain). 21 tablet Brinsley Wence, [place], FNP   guaiFENesin-codeine (CHERATUSSIN AC) 100-10 MG/5ML syrup  (Status: Discontinued) Take 10 mLs by mouth 3 (three) times daily as needed for cough. 120 mL Liron Eissler, Lelon Mast, FNP   guaiFENesin-codeine (CHERATUSSIN AC) 100-10 MG/5ML syrup Take 10 mLs by mouth 3 (three) times daily as needed for cough. 120 mL Lurline Idol, FNP      I have reviewed the PDMP during this encounter.   Lurline Idol, [place] 03/01/22 1914

## 2022-03-01 NOTE — Discharge Instructions (Signed)
Take medications as prescribed. You may take tylenol in addition to the prescribed medications as needed as well. Drink plenty of fluids. Stay in home isolation until you receive results of your COVID test. You will only be notified for positive results. You may go online to MyChart and review your results. Go to the ED immediately if you get worse or have any other symptoms.

## 2022-03-01 NOTE — ED Triage Notes (Signed)
Pt c/o body aches, fever and SOB that started yesterday. Took ibuprofen for fever but not working.

## 2022-03-02 ENCOUNTER — Telehealth (HOSPITAL_COMMUNITY): Payer: Self-pay | Admitting: Emergency Medicine

## 2022-03-02 MED ORDER — OSELTAMIVIR PHOSPHATE 75 MG PO CAPS: 75.0000 mg | ORAL_CAPSULE | Freq: Two times a day (BID) | ORAL | 0 refills | Status: DC

## 2022-03-04 LAB — CULTURE, GROUP A STREP (THRC)

## 2023-05-09 ENCOUNTER — Encounter (HOSPITAL_COMMUNITY): Payer: Self-pay

## 2023-05-09 ENCOUNTER — Ambulatory Visit (INDEPENDENT_AMBULATORY_CARE_PROVIDER_SITE_OTHER): Payer: BC Managed Care – PPO

## 2023-05-09 ENCOUNTER — Ambulatory Visit (HOSPITAL_COMMUNITY)
Admission: EM | Admit: 2023-05-09 | Discharge: 2023-05-09 | Disposition: A | Discharge status: Home / Self Care | Payer: BC Managed Care – PPO | Attending: Family Medicine | Admitting: Family Medicine

## 2023-05-09 DIAGNOSIS — M5416 Radiculopathy, lumbar region: Secondary | ICD-10-CM

## 2023-05-09 MED ORDER — TIZANIDINE HCL 4 MG PO TABS: 4.0000 mg | ORAL_TABLET | Freq: Four times a day (QID) | ORAL | 0 refills | Status: DC | PRN

## 2023-05-09 MED ORDER — METHYLPREDNISOLONE SODIUM SUCC 125 MG IJ SOLR
80.0000 mg | Freq: Once | INTRAMUSCULAR | Status: AC
  Administered 2023-05-09: 80 mg via INTRAMUSCULAR

## 2023-05-09 MED ORDER — KETOROLAC TROMETHAMINE 30 MG/ML IJ SOLN
INTRAMUSCULAR | Status: AC
  Filled 2023-05-09: qty 1

## 2023-05-09 MED ORDER — METHYLPREDNISOLONE SODIUM SUCC 125 MG IJ SOLR
INTRAMUSCULAR | Status: AC
  Filled 2023-05-09: qty 2

## 2023-05-09 MED ORDER — METHYLPREDNISOLONE 4 MG PO TBPK: ORAL_TABLET | ORAL | 0 refills | Status: DC

## 2023-05-09 MED ORDER — KETOROLAC TROMETHAMINE 30 MG/ML IJ SOLN
30.0000 mg | Freq: Once | INTRAMUSCULAR | Status: AC
  Administered 2023-05-09: 30 mg via INTRAMUSCULAR

## 2023-05-09 MED ORDER — NAPROXEN 500 MG PO TABS: 500.0000 mg | ORAL_TABLET | Freq: Two times a day (BID) | ORAL | 0 refills | Status: DC

## 2023-05-09 NOTE — ED Provider Notes (Signed)
[facility]    CSN: [id number] Arrival date & time: 05/09/23  1125      History   Chief Complaint Chief Complaint  Patient presents with   Back Pain    HPI Cassie Knapp is a 38 y.o. female.    Back Pain Associated symptoms: numbness   Patient is here for lower back pain x 2 weeks, with pain that goes down the right leg.  Goes to the foot.  The 2 lateral toes are numb.  This has been happening for a while.  Using otc meds and icy hot without much help.  No known injury.        Past Medical History:  Diagnosis Date   Bronchitis    History of kidney stones    Medical history non-contributory     There are no active problems to display for this patient.   Past Surgical History:  Procedure Laterality Date   ANTERIOR CRUCIATE LIGAMENT (ACL) REVISION Right 10/18/2016   Procedure: ANTERIOR CRUCIATE LIGAMENT (ACL) REVISION;  Surgeon: Christena Flake, MD;  Location: [facility] ORS;  Service: Orthopedics;  Laterality: Right;   KNEE ARTHROSCOPY W/ ACL RECONSTRUCTION      OB History   No obstetric history on file.      Home Medications    Prior to Admission medications   Medication Sig Start Date End Date Taking? Authorizing Provider  Vitamin D, Ergocalciferol, (DRISDOL) 1.25 MG (50000 UNIT) CAPS capsule Take by mouth. 04/17/23 04/16/24 Yes [provider]  guaiFENesin-codeine (CHERATUSSIN AC) 100-10 MG/5ML syrup Take 10 mLs by mouth 3 (three) times daily as needed for cough. 03/01/22   Lurline Idol, FNP  ibuprofen (ADVIL) 800 MG tablet Take 1 tablet (800 mg total) by mouth every 8 (eight) hours as needed for fever or headache (pain). 03/01/22   Lurline Idol, FNP  oseltamivir (TAMIFLU) 75 MG capsule Take 1 capsule (75 mg total) by mouth every 12 (twelve) hours. 03/02/22   LampteyBritta Mccreedy, MD    Family History History reviewed. No pertinent family history.  Social History Social History   Tobacco Use   Smoking status: Every Day     Current packs/day: 0.25    Types: Cigarettes   Smokeless tobacco: Never  Vaping Use   Vaping status: Never Used  Substance Use Topics   Alcohol use: No   Drug use: No     Allergies   Peanut-containing drug products   Review of Systems Review of Systems  Constitutional: Negative.   HENT: Negative.    Respiratory: Negative.    Cardiovascular: Negative.   Gastrointestinal: Negative.   Musculoskeletal:  Positive for back pain.  Skin: Negative.   Neurological:  Positive for numbness.  Psychiatric/Behavioral: Negative.       Physical Exam Triage Vital Signs ED Triage Vitals  Encounter Vitals Group     BP 05/09/23 1256 102/65     Systolic BP Percentile --      Diastolic BP Percentile --      Pulse Rate 05/09/23 1256 72     Resp 05/09/23 1256 16     Temp 05/09/23 1256 98.9 F (37.2 C)     Temp Source 05/09/23 1256 Oral     SpO2 05/09/23 1256 98 %     Weight --      Height --      Head Circumference --      Peak Flow --      Pain Score 05/09/23 1258 10  Pain Loc --      Pain Education --      Exclude from Growth Chart --    No data found.  Updated Vital Signs BP 102/65 (BP Location: Right Arm)   Pulse 72   Temp 98.9 F (37.2 C) (Oral)   Resp 16   LMP 04/18/2023 (Approximate)   SpO2 98%   Visual Acuity Right Eye Distance:   Left Eye Distance:   Bilateral Distance:    Right Eye Near:   Left Eye Near:    Bilateral Near:     Physical Exam Constitutional:      Appearance: Normal appearance. She is normal weight.     Comments: She is standing, leaning over the exam table for comfort  Cardiovascular:     Rate and Rhythm: Normal rate and regular rhythm.  Pulmonary:     Effort: Pulmonary effort is normal.     Breath sounds: Normal breath sounds.  Musculoskeletal:     Comments: TTP to the lumbar spine, and across the whole low back;  decreased ROM due to pain.   Neurological:     Mental Status: She is alert.  Psychiatric:        Mood and Affect:  Mood normal.      UC Treatments / Results  Labs (all labs ordered are listed, but only abnormal results are displayed) Labs Reviewed - No data to display  EKG   Radiology No results found.  Procedures Procedures (including critical care time)  Medications Ordered in UC Medications  ketorolac (TORADOL) 30 MG/ML injection 30 mg (has no administration in time range)  methylPREDNISolone sodium succinate (SOLU-MEDROL) 125 mg/2 mL injection 80 mg (has no administration in time range)    Initial Impression / Assessment and Plan / UC Course  I have reviewed the triage vital signs and the nursing notes.  Pertinent labs & imaging results that were available during my care of the patient were reviewed by me and considered in my medical decision making (see chart for details).   Final Clinical Impressions(s) / UC Diagnoses   Final diagnoses:  Lumbar back pain with radiculopathy affecting right lower extremity     Discharge Instructions      You were seen today for back pain with radiculopathy.  Your xray does not show anything concerning.  As dicussed, please follow up with your primary care provider for further care and work up.  I have given you a shot of pain medication and steroid while here.  I have sent an anti-inflammatory, a steroid pack (which you will start tomorrow) and a muscle relaxer.  The may  make you tired/sleepy so please take when home and not driving.  Alternate heating pad and ice pack to the back as well.     ED Prescriptions     Medication Sig Dispense Auth. Provider   methylPREDNISolone (MEDROL DOSEPAK) 4 MG TBPK tablet Take as directed 1 each Felesia Stahlecker, MD   tiZANidine (ZANAFLEX) 4 MG tablet Take 1 tablet (4 mg total) by mouth every 6 (six) hours as needed for muscle spasms. 30 tablet Dondre Catalfamo, MD   naproxen (NAPROSYN) 500 MG tablet Take 1 tablet (500 mg total) by mouth 2 (two) times daily. 30 tablet Jannifer Franklin, MD      PDMP not  reviewed this encounter.   Jannifer Franklin, MD 05/09/23 1349

## 2023-05-09 NOTE — ED Triage Notes (Signed)
Pt states lower back pain radiating down right leg for the past 2 weeks.  States she has been taking ibuprofen and icy hot at home with no relief.

## 2023-05-09 NOTE — Discharge Instructions (Signed)
You were seen today for back pain with radiculopathy.  Your xray does not show anything concerning.  As dicussed, please follow up with your primary care provider for further care and work up.  I have given you a shot of pain medication and steroid while here.  I have sent an anti-inflammatory, a steroid pack (which you will start tomorrow) and a muscle relaxer.  The may  make you tired/sleepy so please take when home and not driving.  Alternate heating pad and ice pack to the back as well.

## 2024-04-09 ENCOUNTER — Emergency Department
Admission: EM | Admit: 2024-04-09 | Discharge: 2024-04-09 | Disposition: A | Discharge status: Home / Self Care | Payer: Self-pay | Attending: Emergency Medicine | Admitting: Emergency Medicine

## 2024-04-09 ENCOUNTER — Other Ambulatory Visit: Payer: Self-pay

## 2024-04-09 ENCOUNTER — Emergency Department (HOSPITAL_COMMUNITY)
Admission: EM | Admit: 2024-04-09 | Discharge: 2024-04-09 | Discharge status: Against Medical Advice | Payer: Self-pay | Attending: Emergency Medicine | Admitting: Emergency Medicine

## 2024-04-09 DIAGNOSIS — X500XXA Overexertion from strenuous movement or load, initial encounter: Secondary | ICD-10-CM | POA: Insufficient documentation

## 2024-04-09 DIAGNOSIS — M545 Low back pain, unspecified: Secondary | ICD-10-CM | POA: Insufficient documentation

## 2024-04-09 DIAGNOSIS — M5442 Lumbago with sciatica, left side: Secondary | ICD-10-CM | POA: Insufficient documentation

## 2024-04-09 DIAGNOSIS — Z5321 Procedure and treatment not carried out due to patient leaving prior to being seen by health care provider: Secondary | ICD-10-CM | POA: Insufficient documentation

## 2024-04-09 DIAGNOSIS — G8929 Other chronic pain: Secondary | ICD-10-CM | POA: Insufficient documentation

## 2024-04-09 MED ORDER — LIDOCAINE 5 % EX PTCH: 1.0000 | MEDICATED_PATCH | Freq: Two times a day (BID) | CUTANEOUS | 0 refills | Status: AC

## 2024-04-09 MED ORDER — LIDOCAINE 5 % EX PTCH
1.0000 | MEDICATED_PATCH | CUTANEOUS | Status: DC
  Administered 2024-04-09: 1 via TRANSDERMAL
  Filled 2024-04-09: qty 1

## 2024-04-09 MED ORDER — PREDNISONE 50 MG PO TABS: 50.0000 mg | ORAL_TABLET | Freq: Every day | ORAL | 0 refills | Status: AC

## 2024-04-09 MED ORDER — CYCLOBENZAPRINE HCL 10 MG PO TABS
10.0000 mg | ORAL_TABLET | Freq: Once | ORAL | Status: AC
  Administered 2024-04-09: 10 mg via ORAL
  Filled 2024-04-09: qty 1

## 2024-04-09 MED ORDER — CYCLOBENZAPRINE HCL 10 MG PO TABS: 10.0000 mg | ORAL_TABLET | Freq: Three times a day (TID) | ORAL | 0 refills | Status: AC | PRN

## 2024-04-09 MED ORDER — KETOROLAC TROMETHAMINE 30 MG/ML IJ SOLN
30.0000 mg | Freq: Once | INTRAMUSCULAR | Status: AC
  Administered 2024-04-09: 30 mg via INTRAMUSCULAR
  Filled 2024-04-09: qty 1

## 2024-04-09 MED ORDER — NAPROXEN 500 MG PO TBEC: 500.0000 mg | DELAYED_RELEASE_TABLET | Freq: Two times a day (BID) | ORAL | 0 refills | Status: AC

## 2024-04-09 MED ORDER — PREDNISONE 20 MG PO TABS
60.0000 mg | ORAL_TABLET | Freq: Once | ORAL | Status: AC
  Administered 2024-04-09: 60 mg via ORAL
  Filled 2024-04-09: qty 3

## 2024-04-09 NOTE — ED Notes (Signed)
 Called pt x3, no answer.  KM

## 2024-04-09 NOTE — ED Triage Notes (Signed)
 PT arrives via POV. PT reports ongoing lower back pain that radiates down both legs. 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 Denies any injury.

## 2024-04-09 NOTE — ED Notes (Signed)
 Called PT three times not in lobby moved to off the floor.SABRASABRA

## 2024-04-09 NOTE — Discharge Instructions (Addendum)
 You have been diagnosed with chronic bilateral low back pain with left-sided sciatica.  Please take Flexeril  1 tablet by mouth every 8 hours.  Please avoid driving while taking Flexeril .  If you are planning to drive please take Flexeril  only at bedtime.  Can apply 1 patch in your lumbar area every 12 hours.  Please take prednisone  1 tablet with breakfast.  Please take naproxen  1 tablet every 12 hours after breakfast and after dinner.  Please drink plenty of fluids.  Please call orthopedics and make an appointment for a follow-up.  Please come back to ED or go to your PCP if you have new symptoms symptoms worsen.  It was a pleasure to help you today.  Kamdyn Colborn, PA-C

## 2024-04-09 NOTE — ED Notes (Signed)
 Called pt for triage, no answer

## 2024-04-09 NOTE — ED Triage Notes (Signed)
 C/O lower back pain for a wile radiates down both legs.  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  Denies injury.

## 2024-04-09 NOTE — ED Provider Notes (Signed)
 "  [facility] Provider Note    Event Date/Time   First MD Initiated Contact with Patient 04/09/24 1751     (approximate)   History   Back Pain    HPI  Cassie Knapp is a 38 y.o. female    with a past medical history of lumbar back pain with radiculopathy anemia, buccal strain, tear of anterior cruciate ligament,  who presents to the ED complaining of back pain. According to the patient, symptoms started 2 days ago with worsening back pain that radiates to right lower leg.  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  Patient denies urinary incontinence, urinary retention, fecal incontinence.  Patient was seen before in urgent care and she was prescribed with prednisone  without relief.  Patient is here with a friend.    There are no active problems to display for this patient.   Physical Exam   Triage Vital Signs: ED Triage Vitals  Encounter Vitals Group     BP 04/09/24 1623 (!) 124/90     Girls Systolic BP Percentile --      Girls Diastolic BP Percentile --      Boys Systolic BP Percentile --      Boys Diastolic BP Percentile --      Pulse Rate 04/09/24 1623 90     Resp 04/09/24 1623 16     Temp 04/09/24 1623 99.2 F (37.3 C)     Temp Source 04/09/24 1623 Oral     SpO2 04/09/24 1623 100 %     Weight 04/09/24 1624 154 lb 15.7 oz (70.3 kg)     Height --      Head Circumference --      Peak Flow --      Pain Score 04/09/24 1623 10     Pain Loc --      Pain Education --      Exclude from Growth Chart --     Most recent vital signs: Vitals:   04/09/24 1623  BP: (!) 124/90  Pulse: 90  Resp: 16  Temp: 99.2 F (37.3 C)  SpO2: 100%     Physical Exam Vitals and nursing note reviewed.  During triage patient was hypertensive  General:          Awake, mild distress due to pain CV:                  Good peripheral perfusion. Regular rate and rhythm. Resp:               Normal effort. no tachypnea.Equal breath sounds bilaterally.   Abd:                 No distention.  Soft nontender Other:    Lumbar spine: Skin is intact, no ecchymosis or hematomas.  Tenderness to palpation in the midline and lateral paraspinal muscles.  Positive left SLR.  No signs of saddle anesthesia.  Sensation is intact.  Pulses positive.       ED Results / Procedures / Treatments   Labs (all labs ordered are listed, but only abnormal results are displayed) Labs Reviewed - No data to display   PROCEDURES:  Critical Care performed:   Procedures   MEDICATIONS ORDERED IN ED: Medications - No data to display    IMPRESSION / MDM / ASSESSMENT AND PLAN / ED COURSE  I reviewed the triage vital signs and the nursing notes.  Differential diagnosis includes, but is not limited to, radiculopathy  lumbar strain, spondylolisthesis spondylolithiasis  Patient's presentation is most consistent with acute, uncomplicated illness.   Cassie Knapp is a 38 y.o., female resents today with history of lumbar pain that radiates to the left lower extremity.  See HPI for further information.  On a physical exam patient was hypertensive during triage.  Lumbar spine, skin is intact no ecchymosis no hematomas.  Tenderness to palpation in midline bilateral paraspinal muscles.  Positive left SLR.  No signs of saddle anesthesia.  Sensation is intact.  Pulses positive.  Full ROM limited by pain.  Rest of physical exam is normal Plan Toradol  IM Prednisone  Flexeril  Patient's diagnosis is consistent with lumbar pain with left radiculopathy. I did not order any imaging or labs, physical exam is reassuring.  I did review the patient's allergies and medications.The patient is in stable and satisfactory condition for discharge home  Patient will be discharged home with prescriptions for prednisone , Flexeril , naproxen , lidocaine  patch. Patient is to follow up with orthopedics as needed or otherwise directed.  I did advise patient not to drive while taking Flexeril .  Patient is  given ED precautions to return to the ED for any worsening or new symptoms.  Work note will be provided Discussed plan of care with patient, answered all of patient's questions, patient agreeable to plan of care. Advised patient to take medications according to the instructions on the label. Discussed possible side effects of new medications. Patient verbalized understanding.  FINAL CLINICAL IMPRESSION(S) / ED DIAGNOSES   Final diagnoses:  None     Rx / DC Orders   ED Discharge Orders     None        Note:  This document was prepared using Dragon voice recognition software and may include unintentional dictation errors.   Janit Kast, PA-C 04/09/24 RENARD Willo Dunnings, MD 04/09/24 2315  "
